# Patient Record
Sex: Female | Born: 1945 | Race: White | Hispanic: No | Marital: Married | Smoking: Never smoker
Health system: Southern US, Community
[De-identification: ages and names within clinical notes are randomized; demographics above are authoritative.]

## PROBLEM LIST (undated history)

## (undated) DIAGNOSIS — I4891 Unspecified atrial fibrillation: Secondary | ICD-10-CM

## (undated) DIAGNOSIS — I10 Essential (primary) hypertension: Secondary | ICD-10-CM

## (undated) DIAGNOSIS — Z9049 Acquired absence of other specified parts of digestive tract: Secondary | ICD-10-CM

## (undated) DIAGNOSIS — I4819 Other persistent atrial fibrillation: Secondary | ICD-10-CM

## (undated) DIAGNOSIS — T8859XA Other complications of anesthesia, initial encounter: Secondary | ICD-10-CM

## (undated) DIAGNOSIS — I469 Cardiac arrest, cause unspecified: Secondary | ICD-10-CM

## (undated) DIAGNOSIS — C4491 Basal cell carcinoma of skin, unspecified: Secondary | ICD-10-CM

## (undated) DIAGNOSIS — I499 Cardiac arrhythmia, unspecified: Secondary | ICD-10-CM

## (undated) DIAGNOSIS — I00 Rheumatic fever without heart involvement: Secondary | ICD-10-CM

## (undated) DIAGNOSIS — E876 Hypokalemia: Secondary | ICD-10-CM

## (undated) DIAGNOSIS — E785 Hyperlipidemia, unspecified: Secondary | ICD-10-CM

## (undated) DIAGNOSIS — T4145XA Adverse effect of unspecified anesthetic, initial encounter: Secondary | ICD-10-CM

## (undated) DIAGNOSIS — M1990 Unspecified osteoarthritis, unspecified site: Secondary | ICD-10-CM

## (undated) DIAGNOSIS — E039 Hypothyroidism, unspecified: Secondary | ICD-10-CM

## (undated) DIAGNOSIS — R42 Dizziness and giddiness: Secondary | ICD-10-CM

## (undated) DIAGNOSIS — K859 Acute pancreatitis without necrosis or infection, unspecified: Secondary | ICD-10-CM

## (undated) DIAGNOSIS — E739 Lactose intolerance, unspecified: Secondary | ICD-10-CM

## (undated) HISTORY — DX: Hypothyroidism, unspecified: E03.9

## (undated) HISTORY — DX: Rheumatic fever without heart involvement: I00

## (undated) HISTORY — DX: Acute pancreatitis without necrosis or infection, unspecified: K85.90

## (undated) HISTORY — DX: Dizziness and giddiness: R42

## (undated) HISTORY — DX: Unspecified atrial fibrillation: I48.91

## (undated) HISTORY — DX: Cardiac arrest, cause unspecified: I46.9

## (undated) HISTORY — DX: Unspecified osteoarthritis, unspecified site: M19.90

## (undated) HISTORY — DX: Essential (primary) hypertension: I10

## (undated) HISTORY — DX: Lactose intolerance, unspecified: E73.9

## (undated) HISTORY — DX: Acquired absence of other specified parts of digestive tract: Z90.49

## (undated) HISTORY — DX: Basal cell carcinoma of skin, unspecified: C44.91

## (undated) HISTORY — DX: Hyperlipidemia, unspecified: E78.5

## (undated) HISTORY — DX: Hypokalemia: E87.6

## (undated) HISTORY — DX: Other persistent atrial fibrillation: I48.19

## (undated) HISTORY — DX: Cardiac arrhythmia, unspecified: I49.9

---
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
## 1980-07-24 DIAGNOSIS — K859 Acute pancreatitis without necrosis or infection, unspecified: Secondary | ICD-10-CM

## 1980-07-24 HISTORY — DX: Acute pancreatitis without necrosis or infection, unspecified: K85.90

## 1984-07-24 DIAGNOSIS — I469 Cardiac arrest, cause unspecified: Secondary | ICD-10-CM

## 1984-07-24 HISTORY — DX: Cardiac arrest, cause unspecified: I46.9

## 2004-07-24 HISTORY — PX: OTHER SURGICAL HISTORY: SHX169

## 2004-07-24 HISTORY — PX: MITRAL VALVE REPAIR (MV)/CORONARY ARTERY BYPASS GRAFTING (CABG): SHX5983

## 2014-05-25 HISTORY — PX: ICD GENERATOR CHANGE: SHX5854

## 2017-06-22 ENCOUNTER — Encounter: Payer: Self-pay | Admitting: Internal Medicine

## 2017-07-02 ENCOUNTER — Institutional Professional Consult (permissible substitution): Payer: Self-pay | Admitting: Internal Medicine

## 2017-07-06 ENCOUNTER — Ambulatory Visit: Payer: Medicare Other | Admitting: Internal Medicine

## 2017-07-06 ENCOUNTER — Encounter: Payer: Self-pay | Admitting: Internal Medicine

## 2017-07-06 VITALS — BP 122/78 | HR 78 | Ht 62.0 in | Wt 122.6 lb

## 2017-07-06 DIAGNOSIS — I4901 Ventricular fibrillation: Secondary | ICD-10-CM

## 2017-07-06 DIAGNOSIS — I4891 Unspecified atrial fibrillation: Secondary | ICD-10-CM

## 2017-07-06 DIAGNOSIS — I495 Sick sinus syndrome: Secondary | ICD-10-CM

## 2017-07-06 NOTE — Progress Notes (Signed)
Electrophysiology Office Note   Date:  07/06/2017   ID:  Nicole Hampton, Nicole Hampton, Nicole Hampton, MRN [id number]  PCP:  Shon Baton, MD  Cardiologist:  Dr Selena Batten Primary Electrophysiologist: Dr Rosita Fire  Chief Complaint  Patient presents with  . Atrial Fibrillation     History of Present Illness: Nicole Clouatre. Hampton is a 71 y.o. female who presents today for electrophysiology evaluation.   She is s/p prior VF arrest, years ago.  She is s/p ICD.  She has required prior lead revision.  She has been followed by Dr Rosita Fire at [facility] for many years.  She lives in [place] and presents today to establish in our practice.  She typically is followed twice per year by Dr Rosita Fire or his APP.  She appears to have a chronically elevated RV threshold.  She reports having only inappropriate therapy since device implant (due to prior lead fracture).  She is also s/p MV repair and CABG in 2006.    She is very active.  She exercises regularly without compliant. Today, she denies symptoms of palpitations, chest pain, shortness of breath, orthopnea, PND, lower extremity edema, claudication, dizziness, presyncope, syncope, bleeding, or neurologic sequela. The patient is tolerating medications without difficulties and is otherwise without complaint today.    Past Medical History:  Diagnosis Date  . A-fib ([place])   . BCC (basal cell carcinoma)    SEES DERM.  [name] Hyperlipidemia    WITH ELEVATED LDL/HDL CLEAN CORS 2006. DR. Norville Haggard  . Hypertension   . Hypokalemia    CHRONIC  . Hypothyroidism   . Lactose intolerance   . OA (osteoarthritis)    HANDS AND FEET  . Pancreatitis 1982   GB  . S/P cholecystectomy    OPEN  . Sudden cardiac death ([place]) [place]. AICD  . Vertigo    RECURRENT   Current Outpatient Medications  Medication Sig Dispense Refill  . aMILoride (MIDAMOR) 5 MG tablet Take 5 mg by mouth daily.    [name] apixaban (ELIQUIS) 2.5 MG TABS tablet Take 2.5 mg by mouth 2  (two) times daily.    . Calcium Carbonate-Vitamin D (CALTRATE 600+D PO) Take 2 tablets by mouth daily.     [name] estradiol (ESTRACE) 1 MG tablet Take 1 mg by mouth daily.    [name] ezetimibe (ZETIA) 10 MG tablet Take 10 mg by mouth daily.    [name] Fexofenadine HCl (ALLEGRA ALLERGY PO) Take 1 tablet by mouth daily as needed.     . fluticasone (FLONASE) 50 MCG/ACT nasal spray Place 2 sprays into both nostrils daily as needed for allergies.     [name] levothyroxine (SYNTHROID) 25 MCG tablet Take 25 mcg by mouth daily before breakfast.    . Magnesium 250 MG TABS Take 1 tablet by mouth daily.    . meclizine (ANTIVERT) 12.5 MG tablet Take 12.5 mg by mouth 3 (three) times daily as needed for dizziness.    . Multiple Vitamin (MULTIVITAMIN) tablet Take 1 tablet by mouth daily.    [name] omega-3 acid ethyl esters (LOVAZA) 1 g capsule Take 2 g by mouth daily.    . pindolol (VISKEN) 5 MG tablet Take 3/4 tab by mouth daily.    . potassium chloride (MICRO-K) 10 MEQ CR capsule Take 4 tablets by mouth in the morning and 3 tablets in the evening    . temazepam (RESTORIL) 7.5 MG capsule Take 7.5 mg by mouth at bedtime as needed for sleep.  No current facility-administered medications for this visit.     Allergies:   Cefuroxime axetil; Codeine; Morphine; Quinidine gluconate; Quinidine; and Latex   Social History:  The patient  reports that she quit smoking about 42 years ago. she has never used smokeless tobacco. She reports that she drinks alcohol. She reports that she does not use drugs.   Family History:  The patient's  family history includes ALS in her sister; Arthritis (age of onset: 49) in her mother; Hypertension (age of onset: 85) in her father; Macular degeneration in her brother; Pancreatic cancer in her brother.    ROS:  Please see the history of present illness.   All other systems are personally reviewed and negative.    PHYSICAL EXAM: VS:  BP 122/78   Pulse 78   Ht 5\' 2"  (1.575 m)   Wt 122 lb 9.6 oz (55.6  kg)   SpO2 99%   BMI 22.42 kg/m  , BMI Body mass index is 22.42 kg/m. GEN: Well nourished, well developed, in no acute distress  HEENT: normal  Neck: no JVD, carotid bruits, or masses Cardiac: RRR; no murmurs, rubs, or gallops,no edema  Respiratory:  clear to auscultation bilaterally, normal work of breathing GI: soft, nontender, nondistended, + BS MS: no deformity or atrophy  Skin: warm and dry  Neuro:  Strength and sensation are intact Psych: euthymic mood, full affect  EKG:  EKG is ordered today. The ekg ordered today is personally reviewed and shows atrial paced rhythm with PVC, PR 248 msec, Qtc 453 msec    Wt Readings from Last 3 Encounters:  07/06/17 122 lb 9.6 oz (55.6 kg)     Other studies personally reviewed: Additional studies/ records that were reviewed today include: notes from [facility] EP and also Dr [place] [employer] office Review of the above records today demonstrates: as above   ASSESSMENT AND PLAN:  1.  Prior VF arrest Doing well with ICD in place for many years Normal device function, though RV threshold is chronically elevated.  No changes at this time.  Will follow remotely with [facility]  2. Prior MV repair Repeat echo upon return in 6 months  3. Afib/ atach Very low burden by device interrogation chads2vasc score is at least 3.  She has been placed on eliquis but only at 2.5mg  BID.  She is not interested in increasing to 5mg  bid at this time. No changes.  She may be willing to reconsider if her  AF burden increases.  Follow-up:  Return to see me in 6 months She plans to follow-up with Dr Rosita Fire in January  Current medicines are reviewed at length with the patient today.   The patient does not have concerns regarding her medicines.  The following changes were made today:  none   Signed, Thompson Grayer, MD  07/06/2017 4:01 PM     [facility] [address] [place] [address] [phone] (office) [phone] (fax)

## 2017-07-06 NOTE — Patient Instructions (Signed)
Medication Instructions:  Your physician recommends that you continue on your current medications as directed. Please refer to the Current Medication list given to you today.   Labwork: None ordered   Testing/Procedures: Your physician has requested that you have an echocardiogram. Echocardiography is a painless test that uses sound waves to create images of your heart. It provides your doctor with information about the size and shape of your heart and how well your heart's chambers and valves are working. This procedure takes approximately one hour. There are no restrictions for this procedure.--6 months prior to appointment    Follow-Up: Your physician recommends that you schedule a follow-up appointment in: 6 months with Dr Rayann Heman   Any Other Special Instructions Will Be Listed Below (If Applicable).     If you need a refill on your cardiac medications before your next appointment, please call your pharmacy.

## 2017-09-13 ENCOUNTER — Telehealth: Payer: Self-pay | Admitting: Rheumatology

## 2017-09-13 NOTE — Telephone Encounter (Signed)
Patient called requesting prescription refill of Voltaren Gel.  Patient uses [facility] in [place].

## 2017-09-13 NOTE — Telephone Encounter (Signed)
Left message to advise patient we would be unable to fill Voltaren Gel as she has not had an appointment in our office since 2016. Advised she would need an appointment or to get the prescription filled from PCP.

## 2017-09-13 NOTE — Telephone Encounter (Signed)
Patient was last seen in office in 2016 and is asking for a refill on Voltaren Gel. Patient does not have a follow up with [place]. We were seeing her for OA hands, OA feet, knee joint pain and Raynaud's. Are we okay to refill the Voltaren Gel?

## 2017-09-13 NOTE — Telephone Encounter (Signed)
She will have to be seen again to receive any prescriptions.  Otherwise she can get prescription from her PCP.

## 2017-10-08 ENCOUNTER — Ambulatory Visit (INDEPENDENT_AMBULATORY_CARE_PROVIDER_SITE_OTHER): Payer: Medicare Other | Admitting: *Deleted

## 2017-10-08 DIAGNOSIS — I4901 Ventricular fibrillation: Secondary | ICD-10-CM

## 2017-10-08 NOTE — Progress Notes (Signed)
Remote ICD transmission.   

## 2017-10-09 LAB — CUP PACEART REMOTE DEVICE CHECK
Brady Statistic AP VS Percent: 93.73 %
Brady Statistic AS VP Percent: 0.73 %
Brady Statistic RA Percent Paced: 92.25 %
Date Time Interrogation Session: 20190318073423
HighPow Impedance: 38 Ohm
HighPow Impedance: 49 Ohm
Implantable Lead Implant Date: 20030319
Implantable Lead Location: 753859
Implantable Lead Model: 5076
Lead Channel Impedance Value: 456 Ohm
Lead Channel Pacing Threshold Amplitude: 1 V
Lead Channel Pacing Threshold Amplitude: 2.5 V
Lead Channel Pacing Threshold Pulse Width: 0.4 ms
Lead Channel Sensing Intrinsic Amplitude: 0.625 mV
Lead Channel Sensing Intrinsic Amplitude: 0.625 mV
Lead Channel Setting Pacing Amplitude: 4.5 V
MDC IDC LEAD IMPLANT DT: 20141103
MDC IDC LEAD LOCATION: 753860
MDC IDC MSMT BATTERY REMAINING LONGEVITY: 54 mo
MDC IDC MSMT BATTERY VOLTAGE: 2.97 V
MDC IDC MSMT LEADCHNL RA PACING THRESHOLD PULSEWIDTH: 0.4 ms
MDC IDC MSMT LEADCHNL RV IMPEDANCE VALUE: 703 Ohm
MDC IDC MSMT LEADCHNL RV IMPEDANCE VALUE: 722 Ohm
MDC IDC MSMT LEADCHNL RV SENSING INTR AMPL: 3.75 mV
MDC IDC MSMT LEADCHNL RV SENSING INTR AMPL: 3.75 mV
MDC IDC PG IMPLANT DT: 20171103
MDC IDC SET LEADCHNL RA PACING AMPLITUDE: 2 V
MDC IDC SET LEADCHNL RV PACING PULSEWIDTH: 1 ms
MDC IDC SET LEADCHNL RV SENSING SENSITIVITY: 0.45 mV
MDC IDC STAT BRADY AP VP PERCENT: 0.45 %
MDC IDC STAT BRADY AS VS PERCENT: 5.09 %
MDC IDC STAT BRADY RV PERCENT PACED: 1.21 %

## 2017-10-10 ENCOUNTER — Encounter: Payer: Self-pay | Admitting: Cardiology

## 2017-10-22 ENCOUNTER — Telehealth: Payer: Self-pay | Admitting: Internal Medicine

## 2017-10-22 NOTE — Telephone Encounter (Signed)
°  New message  Pt verbalized that she is calling for RN  To see about her Echo options and loactions

## 2017-10-22 NOTE — Telephone Encounter (Signed)
I spoke with patient. She states she is scheduled for a echo at [facility] with Dr. Clementeen Graham on 11/14/17. She would like to know if she needed to keep her appt for 11/21/17. I explained to patient if she has her echo done at [facility] then she could request a copy or have a copy faxed to our office. Patient states she would follow up with Dr. Verita Lamb office and call back to cancel echo if needed. Patient appreciative of the call

## 2017-11-21 ENCOUNTER — Other Ambulatory Visit (HOSPITAL_COMMUNITY): Payer: Medicare Other

## 2017-12-03 ENCOUNTER — Ambulatory Visit: Payer: Medicare Other | Admitting: Internal Medicine

## 2017-12-03 ENCOUNTER — Encounter: Payer: Self-pay | Admitting: Internal Medicine

## 2017-12-03 VITALS — BP 122/62 | HR 90 | Ht 62.0 in | Wt 119.0 lb

## 2017-12-03 DIAGNOSIS — I495 Sick sinus syndrome: Secondary | ICD-10-CM

## 2017-12-03 DIAGNOSIS — Z95810 Presence of automatic (implantable) cardiac defibrillator: Secondary | ICD-10-CM | POA: Diagnosis not present

## 2017-12-03 DIAGNOSIS — I4901 Ventricular fibrillation: Secondary | ICD-10-CM | POA: Diagnosis not present

## 2017-12-03 DIAGNOSIS — I4891 Unspecified atrial fibrillation: Secondary | ICD-10-CM | POA: Diagnosis not present

## 2017-12-03 LAB — CUP PACEART INCLINIC DEVICE CHECK
Brady Statistic AP VP Percent: 0.37 %
Brady Statistic AP VS Percent: 98.65 %
Brady Statistic AS VP Percent: 0.06 %
Brady Statistic RA Percent Paced: 96.66 %
Brady Statistic RV Percent Paced: 0.5 %
HIGH POWER IMPEDANCE MEASURED VALUE: 39 Ohm
HIGH POWER IMPEDANCE MEASURED VALUE: 55 Ohm
Implantable Lead Implant Date: 20141103
Implantable Lead Location: 753859
Implantable Lead Model: 5076
Lead Channel Impedance Value: 456 Ohm
Lead Channel Impedance Value: 703 Ohm
Lead Channel Pacing Threshold Pulse Width: 0.4 ms
Lead Channel Pacing Threshold Pulse Width: 1 ms
Lead Channel Sensing Intrinsic Amplitude: 2.625 mV
Lead Channel Sensing Intrinsic Amplitude: 3.125 mV
Lead Channel Setting Pacing Amplitude: 2 V
Lead Channel Setting Pacing Amplitude: 4.5 V
Lead Channel Setting Pacing Pulse Width: 1 ms
Lead Channel Setting Sensing Sensitivity: 0.45 mV
MDC IDC LEAD IMPLANT DT: 20030319
MDC IDC LEAD LOCATION: 753860
MDC IDC MSMT BATTERY REMAINING LONGEVITY: 49 mo
MDC IDC MSMT BATTERY VOLTAGE: 2.97 V
MDC IDC MSMT LEADCHNL RA PACING THRESHOLD AMPLITUDE: 1 V
MDC IDC MSMT LEADCHNL RA SENSING INTR AMPL: 0.625 mV
MDC IDC MSMT LEADCHNL RA SENSING INTR AMPL: 1.625 mV
MDC IDC MSMT LEADCHNL RV IMPEDANCE VALUE: 779 Ohm
MDC IDC MSMT LEADCHNL RV PACING THRESHOLD AMPLITUDE: 3 V
MDC IDC PG IMPLANT DT: 20171103
MDC IDC SESS DTM: 20190513163843
MDC IDC STAT BRADY AS VS PERCENT: 0.92 %

## 2017-12-03 NOTE — Progress Notes (Signed)
PCP: Shon Baton, MD Primary Cardiologist: [place] Primary EP: Dr Leonidas Romberg previously  Nicole Hampton. Nicole Hampton is a 72 y.o. female who presents today for routine electrophysiology followup.  Since last being seen in our clinic, the patient reports doing very well.  Today, she denies symptoms of palpitations, chest pain, shortness of breath,  lower extremity edema, dizziness, presyncope, syncope, or ICD shocks.  The patient is otherwise without complaint today.   Past Medical History:  Diagnosis Date  . A-fib ([place])   . BCC (basal cell carcinoma)    SEES DERM.  [name] Hyperlipidemia    WITH ELEVATED LDL/HDL CLEAN CORS 2006. DR. Norville Haggard  . Hypertension   . Hypokalemia    CHRONIC  . Hypothyroidism   . Lactose intolerance   . OA (osteoarthritis)    HANDS AND FEET  . Pancreatitis 1982   GB  . S/P cholecystectomy    OPEN  . Sudden cardiac death ([place]) [place]. AICD  . Vertigo    RECURRENT    ROS- all systems are reviewed and negative except as per HPI above  Current Outpatient Medications  Medication Sig Dispense Refill  . aMILoride (MIDAMOR) 5 MG tablet Take 5 mg by mouth daily.    [name] apixaban (ELIQUIS) 2.5 MG TABS tablet Take 2.5 mg by mouth 2 (two) times daily.    . Calcium Carbonate-Vitamin D (CALTRATE 600+D PO) Take 2 tablets by mouth daily.     [name] estradiol (ESTRACE) 1 MG tablet Take 1 mg by mouth daily.    [name] ezetimibe (ZETIA) 10 MG tablet Take 10 mg by mouth daily.    [name] Fexofenadine HCl (ALLEGRA ALLERGY PO) Take 1 tablet by mouth daily as needed.     . fluticasone (FLONASE) 50 MCG/ACT nasal spray Place 2 sprays into both nostrils daily as needed for allergies.     [name] levothyroxine (SYNTHROID) 25 MCG tablet Take 25 mcg by mouth daily before breakfast.    . Magnesium 250 MG TABS Take 1 tablet by mouth daily.    . meclizine (ANTIVERT) 12.5 MG tablet Take 12.5 mg by mouth 3 (three) times daily as needed for dizziness.    . Multiple Vitamin (MULTIVITAMIN)  tablet Take 1 tablet by mouth daily.    [name] omega-3 acid ethyl esters (LOVAZA) 1 g capsule Take 2 g by mouth daily.    . pindolol (VISKEN) 5 MG tablet Take 3/4 tab by mouth daily.    . potassium chloride (MICRO-K) 10 MEQ CR capsule Take 4 tablets by mouth in the morning and 3 tablets in the evening    . temazepam (RESTORIL) 7.5 MG capsule Take 7.5 mg by mouth at bedtime as needed for sleep.      No current facility-administered medications for this visit.     Physical Exam: Vitals:   12/03/17 1518  BP: 122/62  Pulse: 90  SpO2: 99%  Weight: 119 lb (54 kg)  Height: 5\' 2"  (1.575 m)    GEN- The patient is well appearing, alert and oriented x 3 today.   Head- normocephalic, atraumatic Eyes-  Sclera clear, conjunctiva pink Ears- hearing intact Oropharynx- clear Lungs- Clear to ausculation bilaterally, normal work of breathing Chest- ICD pocket is well healed Heart- Regular rate and rhythm, no murmurs, rubs or gallops, PMI not laterally displaced GI- soft, NT, ND, + BS Extremities- no clubbing, cyanosis, or edema  ICD interrogation- reviewed in detail today,  See PACEART report   Echo 11/21/17 reveals EF  60-65%, RV size/ function are normal LA is moderately enlarged Mean MV gradient is 5.9 mm Hg s/p repair, mild to moderate TR, no pulm htn  Wt Readings from Last 3 Encounters:  12/03/17 119 lb (54 kg)  07/06/17 122 lb 9.6 oz (55.6 kg)    Assessment and Plan:  1.  Prior VF Arrest Normal ICD function See Pace Art report RV threshold is chronically elevated  2. Afib/ atach Burden is 0.4 % by device interrogation chads2vasc score is 3.  On elilqus  3. S/p MV repair Echo reviewed from [facility]  Return to see EP NP in 6 months I will see in a year.  I would like to have an echo upon return to see me in a year  Thompson Grayer MD, [facility] 12/03/2017 3:50 PM

## 2017-12-03 NOTE — Patient Instructions (Addendum)
Medication Instructions:  Your physician recommends that you continue on your current medications as directed. Please refer to the Current Medication list given to you today.  Labwork: None ordered.  Testing/Procedures: None ordered.  Follow-Up:  Your physician wants you to follow-up in: 6 months with Chanetta Marshall, NP.   You will receive a reminder letter in the mail two months in advance. If you don't receive a letter, please call our office to schedule the follow-up appointment.  Your physician wants you to follow-up in: one year with Dr. Rayann Heman.   You will receive a reminder letter in the mail two months in advance. If you don't receive a letter, please call our office to schedule the follow-up appointment.  Remote monitoring is used to monitor your ICD from home. This monitoring reduces the number of office visits required to check your device to one time per year. It allows [place] to keep an eye on the functioning of your device to ensure it is working properly. You are scheduled for a device check from home on 01/07/2018. You may send your transmission at any time that day. If you have a wireless device, the transmission will be sent automatically. After your physician reviews your transmission, you will receive a postcard with your next transmission date.  Any Other Special Instructions Will Be Listed Below (If Applicable).  If you need a refill on your cardiac medications before your next appointment, please call your pharmacy.

## 2018-01-07 ENCOUNTER — Ambulatory Visit (INDEPENDENT_AMBULATORY_CARE_PROVIDER_SITE_OTHER): Payer: Medicare Other | Admitting: *Deleted

## 2018-01-07 DIAGNOSIS — I4901 Ventricular fibrillation: Secondary | ICD-10-CM

## 2018-01-07 NOTE — Progress Notes (Signed)
Remote ICD transmission.   

## 2018-01-08 LAB — CUP PACEART REMOTE DEVICE CHECK
Battery Remaining Longevity: 47 mo
Brady Statistic AP VP Percent: 0.3 %
Brady Statistic AP VS Percent: 98.8 %
Brady Statistic AS VS Percent: 0.9 %
Brady Statistic RA Percent Paced: 98 %
Brady Statistic RV Percent Paced: 0.36 %
HighPow Impedance: 38 Ohm
HighPow Impedance: 49 Ohm
Implantable Lead Implant Date: 20030319
Implantable Lead Implant Date: 20141103
Implantable Lead Location: 753860
Implantable Lead Model: 5076
Implantable Lead Model: 6947
Lead Channel Impedance Value: 456 Ohm
Lead Channel Impedance Value: 665 Ohm
Lead Channel Pacing Threshold Pulse Width: 0.4 ms
Lead Channel Pacing Threshold Pulse Width: 0.4 ms
Lead Channel Sensing Intrinsic Amplitude: 0.5 mV
Lead Channel Sensing Intrinsic Amplitude: 0.5 mV
Lead Channel Sensing Intrinsic Amplitude: 2.875 mV
Lead Channel Setting Pacing Amplitude: 2 V
MDC IDC LEAD LOCATION: 753859
MDC IDC MSMT BATTERY VOLTAGE: 2.97 V
MDC IDC MSMT LEADCHNL RA PACING THRESHOLD AMPLITUDE: 1 V
MDC IDC MSMT LEADCHNL RV IMPEDANCE VALUE: 646 Ohm
MDC IDC MSMT LEADCHNL RV PACING THRESHOLD AMPLITUDE: 2.5 V
MDC IDC MSMT LEADCHNL RV SENSING INTR AMPL: 2.875 mV
MDC IDC PG IMPLANT DT: 20171103
MDC IDC SESS DTM: 20190617073322
MDC IDC SET LEADCHNL RV PACING AMPLITUDE: 4.5 V
MDC IDC SET LEADCHNL RV PACING PULSEWIDTH: 1 ms
MDC IDC SET LEADCHNL RV SENSING SENSITIVITY: 0.45 mV
MDC IDC STAT BRADY AS VP PERCENT: 0 %

## 2018-04-08 ENCOUNTER — Ambulatory Visit (INDEPENDENT_AMBULATORY_CARE_PROVIDER_SITE_OTHER): Payer: Medicare Other | Admitting: *Deleted

## 2018-04-08 DIAGNOSIS — I4901 Ventricular fibrillation: Secondary | ICD-10-CM | POA: Diagnosis not present

## 2018-04-08 NOTE — Progress Notes (Signed)
Remote ICD transmission.   

## 2018-05-01 LAB — CUP PACEART REMOTE DEVICE CHECK
Battery Voltage: 2.97 V
Brady Statistic AP VP Percent: 0.29 %
Brady Statistic AP VS Percent: 99.07 %
Brady Statistic AS VP Percent: 0.02 %
Brady Statistic AS VS Percent: 0.62 %
Date Time Interrogation Session: 20190916052503
HighPow Impedance: 41 Ohm
HighPow Impedance: 53 Ohm
Implantable Lead Implant Date: 20030319
Implantable Lead Location: 753860
Implantable Lead Model: 6947
Lead Channel Impedance Value: 760 Ohm
Lead Channel Pacing Threshold Amplitude: 1 V
Lead Channel Pacing Threshold Pulse Width: 0.4 ms
Lead Channel Sensing Intrinsic Amplitude: 1.875 mV
Lead Channel Sensing Intrinsic Amplitude: 1.875 mV
Lead Channel Sensing Intrinsic Amplitude: 3.625 mV
Lead Channel Setting Pacing Pulse Width: 1 ms
Lead Channel Setting Sensing Sensitivity: 0.45 mV
MDC IDC LEAD IMPLANT DT: 20141103
MDC IDC LEAD LOCATION: 753859
MDC IDC MSMT BATTERY REMAINING LONGEVITY: 44 mo
MDC IDC MSMT LEADCHNL RA IMPEDANCE VALUE: 456 Ohm
MDC IDC MSMT LEADCHNL RV IMPEDANCE VALUE: 703 Ohm
MDC IDC MSMT LEADCHNL RV PACING THRESHOLD AMPLITUDE: 2.5 V
MDC IDC MSMT LEADCHNL RV PACING THRESHOLD PULSEWIDTH: 0.4 ms
MDC IDC MSMT LEADCHNL RV SENSING INTR AMPL: 3.625 mV
MDC IDC PG IMPLANT DT: 20171103
MDC IDC SET LEADCHNL RA PACING AMPLITUDE: 2 V
MDC IDC SET LEADCHNL RV PACING AMPLITUDE: 4.5 V
MDC IDC STAT BRADY RA PERCENT PACED: 98.19 %
MDC IDC STAT BRADY RV PERCENT PACED: 0.4 %

## 2018-06-05 ENCOUNTER — Encounter: Payer: Self-pay | Admitting: Nurse Practitioner

## 2018-06-05 ENCOUNTER — Ambulatory Visit: Payer: Medicare Other | Admitting: Nurse Practitioner

## 2018-06-05 VITALS — BP 122/70 | HR 93 | Ht 62.0 in | Wt 119.0 lb

## 2018-06-05 DIAGNOSIS — I480 Paroxysmal atrial fibrillation: Secondary | ICD-10-CM

## 2018-06-05 DIAGNOSIS — Z9889 Other specified postprocedural states: Secondary | ICD-10-CM | POA: Diagnosis not present

## 2018-06-05 DIAGNOSIS — I4901 Ventricular fibrillation: Secondary | ICD-10-CM | POA: Diagnosis not present

## 2018-06-05 LAB — CUP PACEART INCLINIC DEVICE CHECK
Implantable Lead Implant Date: 20030319
Implantable Lead Location: 753859
Implantable Lead Location: 753860
Implantable Lead Model: 6947
Implantable Pulse Generator Implant Date: 20171103
MDC IDC LEAD IMPLANT DT: 20141103
MDC IDC SESS DTM: 20191113082729

## 2018-06-05 NOTE — Progress Notes (Signed)
Electrophysiology Office Note Date: 06/05/2018  ID:  Nicole Hampton, Baugh September 19, 1945, MRN [id number]  PCP: Shon Baton, MD Primary Cardiologist: [place] Electrophysiologist: Rosita Fire -> [facility]  CC: Routine ICD follow-up  Nicole Hampton is a 71 y.o. female seen today for Dr Rayann Heman.  She presents today for routine electrophysiology followup.  Since last being seen in our clinic, the patient reports doing very well. She denies chest pain, palpitations, dyspnea, PND, orthopnea, nausea, vomiting, dizziness, syncope, edema, weight gain, or early satiety.  She has not had ICD shocks.   Device History: MDT single chamber ICD implanted 2003 for VF arrest; upgrade to dual chamber ICD 2014; gen change 2017 History of appropriate therapy: No History of AAD therapy: No   Past Medical History:  Diagnosis Date  . A-fib ([place])   . BCC (basal cell carcinoma)    SEES DERM.  [name] Hyperlipidemia    WITH ELEVATED LDL/HDL CLEAN CORS 2006. DR. Norville Haggard  . Hypertension   . Hypokalemia    CHRONIC  . Hypothyroidism   . Lactose intolerance   . OA (osteoarthritis)    HANDS AND FEET  . Pancreatitis 1982   GB  . S/P cholecystectomy    OPEN  . Sudden cardiac death ([place]) [place]. AICD  . Vertigo    RECURRENT   Past Surgical History:  Procedure Laterality Date  . CESAREAN SECTION     x1  . ICD GENERATOR CHANGE  05/25/2014   MDT Gwyneth Revels S DR ICD implanted by Dr Rosita Fire (secondary prevention)  . 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 OPEN HEART SURGERY WITH DEFIB  2006   1996    Current Outpatient Medications  Medication Sig Dispense Refill  . aMILoride (MIDAMOR) 5 MG tablet Take 5 mg by mouth daily.    [name] apixaban (ELIQUIS) 2.5 MG TABS tablet Take 2.5 mg by mouth 2 (two) times daily.    . Calcium Carbonate-Vitamin D (CALTRATE 600+D PO) Take 2 tablets by mouth daily.     [name] estradiol  (ESTRACE) 1 MG tablet Take 1 mg by mouth daily.    [name] ezetimibe (ZETIA) 10 MG tablet Take 10 mg by mouth daily.    [name] Fexofenadine HCl (ALLEGRA ALLERGY PO) Take 1 tablet by mouth daily as needed.     . fluticasone (FLONASE) 50 MCG/ACT nasal spray Place 2 sprays into both nostrils daily as needed for allergies.     [name] levothyroxine (SYNTHROID) 25 MCG tablet Take 25 mcg by mouth daily before breakfast.    . Magnesium 250 MG TABS Take 1 tablet by mouth daily.    . meclizine (ANTIVERT) 12.5 MG tablet Take 12.5 mg by mouth 3 (three) times daily as needed for dizziness.    . Multiple Vitamin (MULTIVITAMIN) tablet Take 1 tablet by mouth daily.    [name] omega-3 acid ethyl esters (LOVAZA) 1 g capsule Take 2 g by mouth daily.    . pindolol (VISKEN) 5 MG tablet Take 3/4 tab by mouth daily.    . potassium chloride (MICRO-K) 10 MEQ CR capsule Take 4 tablets by mouth in the morning and 3 tablets in the evening    . temazepam (RESTORIL) 7.5 MG capsule Take 7.5 mg by mouth at bedtime as needed for sleep.      No current facility-administered medications for this visit.     Allergies:   Cefuroxime axetil; Codeine;  Morphine; Quinidine gluconate; Quinidine; and Latex   Social History: Social History   Socioeconomic History  . Marital status: Married    Spouse name: [place]  . Number of children: 2  . Years of education: Not on file  . Highest education level: Not on file  Occupational History  . Occupation: [employer]  Social Needs  . Financial resource strain: Not on file  . Food insecurity:    Worry: Not on file    Inability: Not on file  . Transportation needs:    Medical: Not on file    Non-medical: Not on file  Tobacco Use  . Smoking status: Former Smoker    Last attempt to quit: 06/23/1975    Years since quitting: 42.9  . Smokeless tobacco: Never Used  Substance and Sexual Activity  . Alcohol use: Yes  . Drug use: No  . Sexual activity: Not on file  Lifestyle  . Physical activity:      Days per week: Not on file    Minutes per session: Not on file  . Stress: Not on file  Relationships  . Social connections:    Talks on phone: Not on file    Gets together: Not on file    Attends religious service: Not on file    Active member of club or organization: Not on file    Attends meetings of clubs or organizations: Not on file    Relationship status: Not on file  . Intimate partner violence:    Fear of current or ex partner: Not on file    Emotionally abused: Not on file    Physically abused: Not on file    Forced sexual activity: Not on file  Other Topics Concern  . Not on file  Social History Narrative  . Not on file    Family History: Family History  Problem Relation Age of Onset  . Arthritis Mother 62       DROWNING WITH HURRICANE [name]  . Hypertension Father 34       DROWNING WITH HURRICANE [name]  . ALS Sister   . Pancreatic cancer Brother   . Macular degeneration Brother     Review of Systems: All other systems reviewed and are otherwise negative except as noted above.   Physical Exam: VS:  BP 122/70   Pulse 93   Ht 5\' 2"  (1.575 m)   Wt 119 lb (54 kg)   SpO2 99%   BMI 21.77 kg/m  , BMI Body mass index is 21.77 kg/m.  GEN- The patient is well appearing, alert and oriented x 3 today.   HEENT: normocephalic, atraumatic; sclera clear, conjunctiva pink; hearing intact; oropharynx clear; neck supple  Lungs- Clear to ausculation bilaterally, normal work of breathing.  No wheezes, rales, rhonchi Heart- Regular rate and rhythm  GI- soft, non-tender, non-distended, bowel sounds present  Extremities- no clubbing, cyanosis, or edema  MS- no significant deformity or atrophy Skin- warm and dry, no rash or lesion; ICD pocket well healed Psych- euthymic mood, full affect Neuro- strength and sensation are intact  ICD interrogation- reviewed in detail today,  See PACEART report  EKG:  EKG is not ordered today.  Recent Labs: No results found for  requested labs within last 8760 hours.   Wt Readings from Last 3 Encounters:  06/05/18 119 lb (54 kg)  12/03/17 119 lb (54 kg)  07/06/17 122 lb 9.6 oz (55.6 kg)     Other studies Reviewed: Additional studies/ records that were  reviewed today include: Dr Jackalyn Lombard office notes   Assessment and Plan:  1.  Prior VF arrest 1 episode of VT terminated with 1 spin of ATP 05/05/18, cycle length 256msec Normal ICD function See Pace Art report No changes today RV threshold is chronically elevated Labs recently checked by PCP  2.  Paroxysmal atrial fibrillation Continue Eliquis for CHADS2VASC of 3 Burden by device interrogation 0.1% CBC, BMET recently checked by PCP She should be on Eliquis 5mg  twice daily - she declines change in dose today   3.  S/p MV repair Repeat echo in 6 months    Current medicines are reviewed at length with the patient today.   The patient does not have concerns regarding her medicines.  The following changes were made today:  none  Labs/ tests ordered today include: echo in 6 months  Orders Placed This Encounter  Procedures  . CUP PACEART [place]  . ECHOCARDIOGRAM COMPLETE     Disposition:   Follow up with [facility], Dr Rayann Heman 6 months with echo at that time     Signed, Chanetta Marshall, NP 06/05/2018 9:04 AM  [facility] [address] [place] [address] [phone] (office) [phone] (fax)

## 2018-06-05 NOTE — Patient Instructions (Addendum)
Medication Instructions:  NONE ORDERED  TODAY  If you need a refill on your cardiac medications before your next appointment, please call your pharmacy.   Lab work: NONE ORDERED  TODAY   If you have labs (blood work) drawn today and your tests are completely normal, you will receive your results only by: [name] MyChart Message (if you have MyChart) OR . A paper copy in the mail If you have any lab test that is abnormal or we need to change your treatment, we will call you to review the results.  Testing/Procedures:  SAME DAY AS [facility]  APPT Your physician has requested that you have an echocardiogram. Echocardiography is a painless test that uses sound waves to create images of your heart. It provides your doctor with information about the size and shape of your heart and how well your heart's chambers and valves are working. This procedure takes approximately one hour. There are no restrictions for this procedure.   Follow-Up:  AS SCHEDULED WITH DR Rayann Heman   Remote monitoring is used to monitor your Pacemaker of ICD from home. This monitoring reduces the number of office visits required to check your device to one time per year. It allows [place] to keep an eye on the functioning of your device to ensure it is working properly. You are scheduled for a device check from home on . 12-16-19You may send your transmission at any time that day. If you have a wireless device, the transmission will be sent automatically. After your physician reviews your transmission, you will receive a postcard with your next transmission date.    Any Other Special Instructions Will Be Listed Below (If Applicable).

## 2018-07-08 ENCOUNTER — Ambulatory Visit (INDEPENDENT_AMBULATORY_CARE_PROVIDER_SITE_OTHER): Payer: Medicare Other

## 2018-07-08 DIAGNOSIS — I495 Sick sinus syndrome: Secondary | ICD-10-CM | POA: Diagnosis not present

## 2018-07-08 NOTE — Progress Notes (Signed)
Remote ICD transmission.   

## 2018-08-18 LAB — CUP PACEART REMOTE DEVICE CHECK
Brady Statistic AS VP Percent: 0.01 %
Date Time Interrogation Session: 20191216083323
HIGH POWER IMPEDANCE MEASURED VALUE: 37 Ohm
HighPow Impedance: 47 Ohm
Implantable Lead Implant Date: 20030319
Implantable Lead Location: 753859
Implantable Lead Location: 753860
Implantable Lead Model: 5076
Implantable Pulse Generator Implant Date: 20171103
Lead Channel Pacing Threshold Amplitude: 0.75 V
Lead Channel Pacing Threshold Amplitude: 2.5 V
Lead Channel Sensing Intrinsic Amplitude: 1.875 mV
Lead Channel Setting Pacing Amplitude: 4.5 V
Lead Channel Setting Pacing Pulse Width: 1 ms
Lead Channel Setting Sensing Sensitivity: 0.45 mV
MDC IDC LEAD IMPLANT DT: 20141103
MDC IDC MSMT BATTERY REMAINING LONGEVITY: 36 mo
MDC IDC MSMT BATTERY VOLTAGE: 2.97 V
MDC IDC MSMT LEADCHNL RA IMPEDANCE VALUE: 399 Ohm
MDC IDC MSMT LEADCHNL RA PACING THRESHOLD PULSEWIDTH: 0.4 ms
MDC IDC MSMT LEADCHNL RA SENSING INTR AMPL: 1.875 mV
MDC IDC MSMT LEADCHNL RV IMPEDANCE VALUE: 608 Ohm
MDC IDC MSMT LEADCHNL RV IMPEDANCE VALUE: 665 Ohm
MDC IDC MSMT LEADCHNL RV PACING THRESHOLD PULSEWIDTH: 0.4 ms
MDC IDC MSMT LEADCHNL RV SENSING INTR AMPL: 3 mV
MDC IDC MSMT LEADCHNL RV SENSING INTR AMPL: 3 mV
MDC IDC SET LEADCHNL RA PACING AMPLITUDE: 2 V
MDC IDC STAT BRADY AP VP PERCENT: 0.25 %
MDC IDC STAT BRADY AP VS PERCENT: 98.72 %
MDC IDC STAT BRADY AS VS PERCENT: 1.03 %
MDC IDC STAT BRADY RA PERCENT PACED: 97.27 %
MDC IDC STAT BRADY RV PERCENT PACED: 0.29 %

## 2018-10-07 ENCOUNTER — Ambulatory Visit (INDEPENDENT_AMBULATORY_CARE_PROVIDER_SITE_OTHER): Payer: Medicare Other | Admitting: *Deleted

## 2018-10-07 ENCOUNTER — Other Ambulatory Visit: Payer: Self-pay

## 2018-10-07 DIAGNOSIS — I4901 Ventricular fibrillation: Secondary | ICD-10-CM | POA: Diagnosis not present

## 2018-10-08 LAB — CUP PACEART REMOTE DEVICE CHECK
Battery Remaining Longevity: 33 mo
Brady Statistic AP VS Percent: 98.09 %
Brady Statistic AS VP Percent: 0.05 %
Brady Statistic RA Percent Paced: 96.96 %
Brady Statistic RV Percent Paced: 0.48 %
HIGH POWER IMPEDANCE MEASURED VALUE: 39 Ohm
HIGH POWER IMPEDANCE MEASURED VALUE: 52 Ohm
Implantable Lead Implant Date: 20141103
Implantable Lead Location: 753859
Implantable Lead Location: 753860
Implantable Lead Model: 5076
Implantable Lead Model: 6947
Implantable Pulse Generator Implant Date: 20171103
Lead Channel Impedance Value: 418 Ohm
Lead Channel Impedance Value: 722 Ohm
Lead Channel Pacing Threshold Amplitude: 2.5 V
Lead Channel Pacing Threshold Pulse Width: 0.4 ms
Lead Channel Sensing Intrinsic Amplitude: 2.125 mV
Lead Channel Sensing Intrinsic Amplitude: 3.375 mV
Lead Channel Setting Pacing Amplitude: 2 V
Lead Channel Setting Pacing Amplitude: 4.5 V
Lead Channel Setting Pacing Pulse Width: 1 ms
Lead Channel Setting Sensing Sensitivity: 0.45 mV
MDC IDC LEAD IMPLANT DT: 20030319
MDC IDC MSMT BATTERY VOLTAGE: 2.94 V
MDC IDC MSMT LEADCHNL RA PACING THRESHOLD AMPLITUDE: 0.875 V
MDC IDC MSMT LEADCHNL RA PACING THRESHOLD PULSEWIDTH: 0.4 ms
MDC IDC MSMT LEADCHNL RA SENSING INTR AMPL: 2.125 mV
MDC IDC MSMT LEADCHNL RV IMPEDANCE VALUE: 760 Ohm
MDC IDC MSMT LEADCHNL RV SENSING INTR AMPL: 3.375 mV
MDC IDC SESS DTM: 20200316062604
MDC IDC STAT BRADY AP VP PERCENT: 0.37 %
MDC IDC STAT BRADY AS VS PERCENT: 1.49 %

## 2018-10-14 NOTE — Progress Notes (Signed)
Remote ICD transmission.   

## 2018-11-12 ENCOUNTER — Other Ambulatory Visit: Payer: Self-pay | Admitting: Internal Medicine

## 2018-12-05 ENCOUNTER — Telehealth: Payer: Self-pay

## 2018-12-05 NOTE — Telephone Encounter (Signed)
Spoke with pt regarding appt on 12/09/18. Pt was advise to check vitals prior to appt. Pt questions were address.

## 2018-12-06 ENCOUNTER — Telehealth (HOSPITAL_COMMUNITY): Payer: Self-pay | Admitting: Radiology

## 2018-12-06 NOTE — Telephone Encounter (Signed)

## 2018-12-09 ENCOUNTER — Other Ambulatory Visit: Payer: Self-pay

## 2018-12-09 ENCOUNTER — Encounter: Payer: Self-pay | Admitting: Internal Medicine

## 2018-12-09 ENCOUNTER — Ambulatory Visit (HOSPITAL_COMMUNITY): Payer: Medicare Other | Attending: Cardiology

## 2018-12-09 ENCOUNTER — Telehealth (INDEPENDENT_AMBULATORY_CARE_PROVIDER_SITE_OTHER): Payer: Medicare Other | Admitting: Internal Medicine

## 2018-12-09 VITALS — Ht 62.0 in | Wt 118.0 lb

## 2018-12-09 DIAGNOSIS — I10 Essential (primary) hypertension: Secondary | ICD-10-CM | POA: Insufficient documentation

## 2018-12-09 DIAGNOSIS — R9431 Abnormal electrocardiogram [ECG] [EKG]: Secondary | ICD-10-CM

## 2018-12-09 DIAGNOSIS — Z9889 Other specified postprocedural states: Secondary | ICD-10-CM

## 2018-12-09 DIAGNOSIS — I495 Sick sinus syndrome: Secondary | ICD-10-CM | POA: Diagnosis not present

## 2018-12-09 DIAGNOSIS — I050 Rheumatic mitral stenosis: Secondary | ICD-10-CM | POA: Insufficient documentation

## 2018-12-09 DIAGNOSIS — I480 Paroxysmal atrial fibrillation: Secondary | ICD-10-CM | POA: Diagnosis not present

## 2018-12-09 DIAGNOSIS — I4901 Ventricular fibrillation: Secondary | ICD-10-CM | POA: Diagnosis not present

## 2018-12-09 DIAGNOSIS — E785 Hyperlipidemia, unspecified: Secondary | ICD-10-CM | POA: Insufficient documentation

## 2018-12-09 NOTE — Telephone Encounter (Signed)
New Message   Patient returning your call, explained you will call her back as instructed.

## 2018-12-09 NOTE — Progress Notes (Signed)
Electrophysiology TeleHealth Note   Due to national recommendations of social distancing due to COVID 19, an audio/video telehealth visit is felt to be most appropriate for this patient at this time.  See MyChart message from today for the patient's consent to telehealth for [facility].  We used facetime for the visit today.   Date:  12/09/2018   ID:  Nicole Hampton, Nicole Hampton [birth year], MRN [id number]  Location: patient's home  Provider location: [address], [place]  Evaluation Performed: Follow-up visit  PCP:  Shon Baton, MD  Cardiologist:  Dr Selena Batten Electrophysiologist:  Dr Rayann Heman (previously Dr Rosita Fire)  Chief Complaint:  MV repair and arrhythmias  History of Present Illness:    Nicole Hampton is a 73 y.o. female who presents via audio/video conferencing for a telehealth visit today.  Since last being seen in our clinic, the patient reports doing very well.  Today, she denies symptoms of chest pain, shortness of breath,  lower extremity edema, dizziness, presyncope, or syncope.  She wakes at night at times with tachypalpitations.  The patient is otherwise without complaint today.  The patient denies symptoms of fevers, chills, cough, or new SOB worrisome for COVID 19.  Past Medical History:  Diagnosis Date  . A-fib ([place])   . BCC (basal cell carcinoma)    SEES DERM.  [name] Hyperlipidemia    WITH ELEVATED LDL/HDL CLEAN CORS 2006. DR. Norville Haggard  . Hypertension   . Hypokalemia    CHRONIC  . Hypothyroidism   . Lactose intolerance   . OA (osteoarthritis)    HANDS AND FEET  . Pancreatitis 1982   GB  . S/P cholecystectomy    OPEN  . 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 Vertigo    RECURRENT    Past Surgical History:  Procedure Laterality Date  . CESAREAN SECTION     x1  . ICD GENERATOR CHANGE  05/25/2014   MDT Gwyneth Revels S DR ICD implanted by Dr Rosita Fire (secondary prevention)  . MITRAL VALVE REPAIR (MV)/CORONARY  ARTERY BYPASS GRAFTING (CABG)  2006   NV ISSUES PROBABLY FROM RHEUMATIC FEVER  . OPEN HEART SURGERY WITH DEFIB  2006   1996    Current Outpatient Medications  Medication Sig Dispense Refill  . aMILoride (MIDAMOR) 5 MG tablet Take 5 mg by mouth daily.    [name] apixaban (ELIQUIS) 2.5 MG TABS tablet Take 2.5 mg by mouth 2 (two) times daily.    . Calcium Carbonate-Vitamin D (CALTRATE 600+D PO) Take 2 tablets by mouth daily.     [name] estradiol (ESTRACE) 1 MG tablet Take 1 mg by mouth daily.    [name] ezetimibe (ZETIA) 10 MG tablet Take 10 mg by mouth daily.    [name] Fexofenadine HCl (ALLEGRA ALLERGY PO) Take 1 tablet by mouth daily as needed.     . fluticasone (FLONASE) 50 MCG/ACT nasal spray Place 2 sprays into both nostrils daily as needed for allergies.     [name] levothyroxine (SYNTHROID) 25 MCG tablet Take 25 mcg by mouth daily before breakfast.    . Magnesium 250 MG TABS Take 1 tablet by mouth daily.    . meclizine (ANTIVERT) 12.5 MG tablet Take 12.5 mg by mouth 3 (three) times daily as needed for dizziness.    . Multiple Vitamin (MULTIVITAMIN) tablet Take 1 tablet by mouth daily.    [name] omega-3 acid ethyl esters (LOVAZA) 1 g capsule Take 2 g by mouth  daily.    . pindolol (VISKEN) 5 MG tablet TAKE ONE TABLET DAILY 90 tablet 1  . potassium chloride (MICRO-K) 10 MEQ CR capsule Take 4 tablets by mouth in the morning and 3 tablets in the evening    . temazepam (RESTORIL) 7.5 MG capsule Take 7.5 mg by mouth at bedtime as needed for sleep.      No current facility-administered medications for this visit.     Allergies:   Cefuroxime axetil; Codeine; Morphine; Quinidine gluconate; Quinidine; and Latex   Social History:  The patient  reports that she quit smoking about 43 years ago. She has never used smokeless tobacco. She reports current alcohol use. She reports that she does not use drugs.   Family History:  The patient's  family history includes ALS in her sister; Arthritis (age of onset: 48) in her mother;  Hypertension (age of onset: 49) in her father; Macular degeneration in her brother; Pancreatic cancer in her brother.   ROS:  Please see the history of present illness.   All other systems are personally reviewed and negative.    Exam:    Vital Signs:  Ht 5\' 2"  (1.575 m)   Wt 118 lb (53.5 kg)   BMI 21.58 kg/m   Well appearing, alert and conversant, regular work of breathing,  good skin color Eyes- anicteric, neuro- grossly intact, skin- no apparent rash or lesions or cyanosis, mouth- oral mucosa is pink   Labs/Other Tests and Data Reviewed:    Recent Labs: No results found for requested labs within last 8760 hours.   Wt Readings from Last 3 Encounters:  12/09/18 118 lb (53.5 kg)  06/05/18 119 lb (54 kg)  12/03/17 119 lb (54 kg)    Echo EF 60%, mild mitral stenosis reviewed at length with her.  Other studies personally reviewed: Additional studies/ records that were reviewed today include: my prior notes  Review of the above records today demonstrates: as above  Last device remote is reviewed from [facility] PDF dated 10/07/2018 which reveals normal device function, nonsustained VT   ASSESSMENT & PLAN:    1.  Prior VF arrest and nonsustained VT Normal ICD function Nonsustained VT noted Echo reviewed RV lead threshold chronically elevated Increase pindolol to 5 g daily  2. Paroxysmal atrial fibrillation chads2vasc score is 3.  afib burden <1%, On eliquis 2.5mg  BID.  We discussed that   3. S/p MV repair Echo reviewed with her today  4. COVID 19 screen The patient denies symptoms of COVID 19 at this time.  The importance of social distancing was discussed today.  Follow-up:  EP NP in a year Next remote: 3 months  Current medicines are reviewed at length with the patient today.   The patient does not have concerns regarding her medicines.  The following changes were made today:  none  Labs/ tests ordered today include:  No orders of the defined types were placed in  this encounter.  Patient Risk:  after full review of this patients clinical status, I feel that they are at moderate risk at this time.  Today, I have spent 22 minutes with the patient with telehealth technology discussing mv repair and arrhythmias .    SignedThompson Grayer, MD  12/09/2018 2:26 PM     [place] [address] [phone] (office) [phone] (fax)

## 2019-01-06 ENCOUNTER — Ambulatory Visit (INDEPENDENT_AMBULATORY_CARE_PROVIDER_SITE_OTHER): Payer: Medicare Other | Admitting: *Deleted

## 2019-01-06 ENCOUNTER — Telehealth: Payer: Self-pay | Admitting: Student

## 2019-01-06 DIAGNOSIS — I4901 Ventricular fibrillation: Secondary | ICD-10-CM

## 2019-01-06 LAB — CUP PACEART REMOTE DEVICE CHECK
Battery Remaining Longevity: 27 mo
Battery Remaining Longevity: 27 mo
Battery Voltage: 2.93 V
Battery Voltage: 2.93 V
Brady Statistic AP VP Percent: 0.47 %
Brady Statistic AP VP Percent: 1.14 %
Brady Statistic AP VS Percent: 83.12 %
Brady Statistic AP VS Percent: 0.3 %
Brady Statistic AS VP Percent: 3.41 %
Brady Statistic AS VP Percent: 39.87 %
Brady Statistic AS VS Percent: 13 %
Brady Statistic AS VS Percent: 58.7 %
Brady Statistic RA Percent Paced: 79.12 %
Brady Statistic RA Percent Paced: 1.12 %
Brady Statistic RV Percent Paced: 4 %
Brady Statistic RV Percent Paced: 42.25 %
Date Time Interrogation Session: 20200615083724
Date Time Interrogation Session: 20200615130036
HighPow Impedance: 36 Ohm
HighPow Impedance: 46 Ohm
HighPow Impedance: 36 Ohm
HighPow Impedance: 46 Ohm
Implantable Lead Implant Date: 20030319
Implantable Lead Implant Date: 20141103
Implantable Lead Implant Date: 20030319
Implantable Lead Implant Date: 20141103
Implantable Lead Location: 753859
Implantable Lead Location: 753860
Implantable Lead Location: 753859
Implantable Lead Location: 753860
Implantable Lead Model: 5076
Implantable Lead Model: 6947
Implantable Lead Model: 5076
Implantable Lead Model: 6947
Implantable Pulse Generator Implant Date: 20171103
Implantable Pulse Generator Implant Date: 20171103
Lead Channel Impedance Value: 418 Ohm
Lead Channel Impedance Value: 646 Ohm
Lead Channel Impedance Value: 646 Ohm
Lead Channel Impedance Value: 418 Ohm
Lead Channel Impedance Value: 646 Ohm
Lead Channel Impedance Value: 646 Ohm
Lead Channel Pacing Threshold Amplitude: 0.875 V
Lead Channel Pacing Threshold Amplitude: 2.5 V
Lead Channel Pacing Threshold Amplitude: 0.875 V
Lead Channel Pacing Threshold Amplitude: 2.5 V
Lead Channel Pacing Threshold Pulse Width: 0.4 ms
Lead Channel Pacing Threshold Pulse Width: 0.4 ms
Lead Channel Pacing Threshold Pulse Width: 0.4 ms
Lead Channel Pacing Threshold Pulse Width: 0.4 ms
Lead Channel Sensing Intrinsic Amplitude: 2.25 mV
Lead Channel Sensing Intrinsic Amplitude: 2.25 mV
Lead Channel Sensing Intrinsic Amplitude: 3.375 mV
Lead Channel Sensing Intrinsic Amplitude: 3.375 mV
Lead Channel Sensing Intrinsic Amplitude: 2.25 mV
Lead Channel Sensing Intrinsic Amplitude: 2.25 mV
Lead Channel Sensing Intrinsic Amplitude: 3.375 mV
Lead Channel Sensing Intrinsic Amplitude: 3.375 mV
Lead Channel Setting Pacing Amplitude: 2 V
Lead Channel Setting Pacing Amplitude: 4.5 V
Lead Channel Setting Pacing Amplitude: 2 V
Lead Channel Setting Pacing Amplitude: 4.5 V
Lead Channel Setting Pacing Pulse Width: 1 ms
Lead Channel Setting Pacing Pulse Width: 1 ms
Lead Channel Setting Sensing Sensitivity: 0.45 mV
Lead Channel Setting Sensing Sensitivity: 0.45 mV

## 2019-01-06 NOTE — Telephone Encounter (Signed)
Had called to discuss increased Afib burden (from 1% burden to > 16% and presented with Afib) and refer to Afib clinic to consider rhythm control, as she was previously paroxysmal.   No answer. LMOM for CB.      Legrand Como [address]" [place], PA-C 01/06/2019 4:22 PM

## 2019-01-06 NOTE — Telephone Encounter (Signed)
Pt returned [place] phone call. She requested that he gives her a call back before 10 am or after 11:30 am because she do have a doctors appointment at that time frame.

## 2019-01-07 NOTE — Telephone Encounter (Signed)
Spoke with patient. She reports that she suspects her thyroid levels are off, getting labs rechecked today at Dr. Keane Police office. Recently started taking Preservision vitamins, read that biotin can cause false thyroid test results.  Denies symptoms with persistent AF/A-flutter since 01/03/19. Some ShOB, but doesn't think it's more than her baseline. Reports compliance with medications, including Eliquis. Pt agreeable to AF Clinic appointment to discuss options. She denies additional questions or concerns at this time and thanked me for my call.

## 2019-01-08 ENCOUNTER — Other Ambulatory Visit: Payer: Self-pay

## 2019-01-08 ENCOUNTER — Ambulatory Visit (HOSPITAL_COMMUNITY)
Admission: RE | Admit: 2019-01-08 | Discharge: 2019-01-08 | Disposition: A | Discharge status: Home / Self Care | Payer: Medicare Other | Source: Ambulatory Visit | Attending: Physician Assistant | Admitting: Physician Assistant

## 2019-01-08 ENCOUNTER — Other Ambulatory Visit (HOSPITAL_COMMUNITY): Payer: Self-pay | Admitting: *Deleted

## 2019-01-08 DIAGNOSIS — I480 Paroxysmal atrial fibrillation: Secondary | ICD-10-CM

## 2019-01-08 MED ORDER — APIXABAN 5 MG PO TABS: 5.0000 mg | ORAL_TABLET | Freq: Two times a day (BID) | ORAL | 6 refills | Status: DC

## 2019-01-08 NOTE — Progress Notes (Signed)
Electrophysiology TeleHealth Note   Due to national recommendations of social distancing due to [place] 19, Audio/video telehealth visit is felt to be most appropriate for this patient at this time.  See consent below from today for patient consent regarding telehealth for the Atrial Fibrillation Clinic. Consent obtained verbally.   Date:  01/08/2019   ID:  Nicole Hampton, Nicole Hampton 08/05/1945, MRN [id number]  Location: home  Provider location: [address] [place],  [address] Evaluation Performed: Follow up  PCP:  Shon Baton, MD  Primary Cardiologist:  Dr Selena Batten Primary Electrophysiologist: Dr Rayann Heman   CC: Follow up for atrial fibrillation   History of Present Illness: Nicole Hampton is a 73 y.o. female who presents via audio/video conferencing for a telehealth visit today. Patient has history of VT and VF arrest s/p ICD, paroxysmal afib, HLD, HTN, CAD s/p CABG, and MVR. Patient was noted to be in persistent AF since 01/03/19. She reports that she has been unaware of her arrhythmia with minimal fatigue. She had recently started a supplement called Preservision for eye health but that is the only change recently. She is no longer taking it. No other triggers. She denies snoring or alcohol use.  Today, she denies symptoms of palpitations, chest pain, shortness of breath, orthopnea, PND, lower extremity edema, claudication, dizziness, presyncope, syncope, bleeding, or neurologic sequela. The patient is tolerating medications without difficulties and is otherwise without complaint today.   she denies symptoms of cough, fevers, chills, or new SOB worrisome for COVID 19.     Atrial Fibrillation Risk Factors:  she does not have symptoms or diagnosis of sleep apnea. she does not have a history of alcohol use. The patient does not have a history of early familial atrial fibrillation or other arrhythmias.  she has a BMI of There is no height or weight on file to calculate BMI.. There  were no vitals filed for this visit.  Past Medical History:  Diagnosis Date  . A-fib ([place])   . BCC (basal cell carcinoma)    SEES DERM.  [name] Hyperlipidemia    WITH ELEVATED LDL/HDL CLEAN CORS 2006. DR. Norville Haggard  . Hypertension   . Hypokalemia    CHRONIC  . Hypothyroidism   . Lactose intolerance   . OA (osteoarthritis)    HANDS AND FEET  . Pancreatitis 1982   GB  . S/P cholecystectomy    OPEN  . 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 Vertigo    RECURRENT   Past Surgical History:  Procedure Laterality Date  . CESAREAN SECTION     x1  . ICD GENERATOR CHANGE  05/25/2014   MDT Gwyneth Revels S DR ICD implanted by Dr Rosita Fire (secondary prevention)  . 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 OPEN HEART SURGERY WITH DEFIB  2006   1996     Current Outpatient Medications  Medication Sig Dispense Refill  . aMILoride (MIDAMOR) 5 MG tablet Take 5 mg by mouth daily.    [name] apixaban (ELIQUIS) 2.5 MG TABS tablet Take 2.5 mg by mouth 2 (two) times daily.    . Calcium Carbonate-Vitamin D (CALTRATE 600+D PO) Take 2 tablets by mouth daily.     [name] estradiol (ESTRACE) 1 MG tablet Take 1 mg by mouth daily.    [name] ezetimibe (ZETIA) 10 MG tablet Take 10 mg by mouth daily.    [name]  Fexofenadine HCl (ALLEGRA ALLERGY PO) Take 1 tablet by mouth daily as needed.     . fluticasone (FLONASE) 50 MCG/ACT nasal spray Place 2 sprays into both nostrils daily as needed for allergies.     [name] levothyroxine (SYNTHROID) 25 MCG tablet Take 25 mcg by mouth daily before breakfast.    . Magnesium 250 MG TABS Take 1 tablet by mouth daily.    . meclizine (ANTIVERT) 12.5 MG tablet Take 12.5 mg by mouth 3 (three) times daily as needed for dizziness.    . Multiple Vitamin (MULTIVITAMIN) tablet Take 1 tablet by mouth daily.    [name] omega-3 acid ethyl esters (LOVAZA) 1 g capsule Take 2 g by mouth daily.    . pindolol (VISKEN) 5 MG  tablet TAKE ONE TABLET DAILY 90 tablet 1  . potassium chloride (MICRO-K) 10 MEQ CR capsule Take 4 tablets by mouth in the morning and 3 tablets in the evening    . temazepam (RESTORIL) 7.5 MG capsule Take 7.5 mg by mouth at bedtime as needed for sleep.      No current facility-administered medications for this encounter.     Allergies:   Cefuroxime axetil, Codeine, Morphine, Quinidine gluconate, Quinidine, and Latex   Social History:  The patient  reports that she quit smoking about 43 years ago. She has never used smokeless tobacco. She reports current alcohol use. She reports that she does not use drugs.   Family History:  The patient's  family history includes ALS in her sister; Arthritis (age of onset: 82) in her mother; Hypertension (age of onset: 40) in her father; Macular degeneration in her brother; Pancreatic cancer in her brother.    ROS:  Please see the history of present illness.   All other systems are personally reviewed and negative.   Exam: Well appearing, alert and conversant, regular work of breathing,  good skin color  Recent Labs: No results found for requested labs within last 8760 hours.  personally reviewed    Other studies personally reviewed: Additional studies/ records that were reviewed today include: Epic notes    ASSESSMENT AND PLAN:  1.  Paroxysmal atrial fibrillation Low burden previously. Has been persistent since 6/12.  Discussed her stroke risk as well as the risks and benefits of anticoagulation. Will increase her Eliquis to 5 mg BID. Patient in agreement with this plan. If patient remains persistently in AF on follow up after 3 weeks of anticoagulation, will consider DCCV.  This patients CHA2DS2-VASc Score and unadjusted Ischemic Stroke Rate (% per year) is equal to 4.8 % stroke rate/year from a score of 4  Above score calculated as 1 point each if present [CHF, HTN, DM, Vascular=MI/PAD/Aortic Plaque, Age if 65-74, or Female] Above score  calculated as 2 points each if present [Age > 75, or Stroke/TIA/TE]  2. HTN No changes today.  3. CAD S/p CABG 2006. No anginal symptoms. Continue present therapy.  4. VF arrest/NSVT S/p ICD. Followed by Dr Rayann Heman and device clinic.    COVID screen The patient does not have any symptoms that suggest any further testing/ screening at this time.  Social distancing reinforced today.    Follow-up in AF clinic in 3 weeks.  Current medicines are reviewed at length with the patient today.   The patient does not have concerns regarding her medicines.  The following changes were made today:  Increase Eliquis  Labs/ tests ordered today include:  No orders of the defined types were placed in this encounter.  Patient Risk:  after full review of this patients clinical status, I feel that they are at moderate risk at this time.   Today, I have spent 24 minutes with the patient with telehealth technology discussing atrial fibrillation, lifestyle, and anticoagulation.    Gwenlyn Perking PA-C 01/08/2019 1:59 PM  Afib [facility] [address] [place], [place] [address] [phone]   I hereby voluntarily request, consent and authorize the [facility] and its employed or contracted physicians, physician assistants, nurse practitioners or other licensed health care professionals (the Practitioner), to provide me with telemedicine health care services (the "Services") as deemed necessary by the treating Practitioner. I acknowledge and consent to receive the Services by the Practitioner via telemedicine. I understand that the telemedicine visit will involve communicating with the Practitioner through live audiovisual communication technology and the disclosure of certain medical information by electronic transmission. I acknowledge that I have been given the opportunity to request an in-person assessment or other available alternative prior to the telemedicine  visit and am voluntarily participating in the telemedicine visit.   I understand that I have the right to withhold or withdraw my consent to the use of telemedicine in the course of my care at any time, without affecting my right to future care or treatment, and that the Practitioner or I may terminate the telemedicine visit at any time. I understand that I have the right to inspect all information obtained and/or recorded in the course of the telemedicine visit and may receive copies of available information for a reasonable fee.  I understand that some of the potential risks of receiving the Services via telemedicine include:   Delay or interruption in medical evaluation due to technological equipment failure or disruption;  Information transmitted may not be sufficient (e.g. poor resolution of images) to allow for appropriate medical decision making by the Practitioner; and/or  In rare instances, security protocols could fail, causing a breach of personal health information.   Furthermore, I acknowledge that it is my responsibility to provide information about my medical history, conditions and care that is complete and accurate to the best of my ability. I acknowledge that Practitioner's advice, recommendations, and/or decision may be based on factors not within their control, such as incomplete or inaccurate data provided by me or distortions of diagnostic images or specimens that may result from electronic transmissions. I understand that the practice of medicine is not an exact science and that Practitioner makes no warranties or guarantees regarding treatment outcomes. I acknowledge that I will receive a copy of this consent concurrently upon execution via email to the email address I last provided but may also request a printed copy by calling the office of the [facility].  I understand that my insurance will be billed for this visit.   I have read or had this consent read to me.   I understand the contents of this consent, which adequately explains the benefits and risks of the Services being provided via telemedicine.  I have been provided ample opportunity to ask questions regarding this consent and the Services and have had my questions answered to my satisfaction.  I give my informed consent for the services to be provided through the use of telemedicine in my medical care  By participating in this telemedicine visit I agree to the above.

## 2019-01-13 ENCOUNTER — Encounter: Payer: Self-pay | Admitting: Cardiology

## 2019-01-13 NOTE — Progress Notes (Signed)
Remote ICD transmission.   

## 2019-01-29 ENCOUNTER — Encounter (HOSPITAL_COMMUNITY): Payer: Self-pay | Admitting: Physician Assistant

## 2019-01-29 ENCOUNTER — Other Ambulatory Visit: Payer: Self-pay

## 2019-01-29 ENCOUNTER — Ambulatory Visit (HOSPITAL_COMMUNITY)
Admission: RE | Admit: 2019-01-29 | Discharge: 2019-01-29 | Disposition: A | Discharge status: Home / Self Care | Payer: Medicare Other | Source: Ambulatory Visit | Attending: Physician Assistant | Admitting: Physician Assistant

## 2019-01-29 VITALS — BP 118/76 | HR 94 | Ht 62.0 in | Wt 118.0 lb

## 2019-01-29 DIAGNOSIS — Z85828 Personal history of other malignant neoplasm of skin: Secondary | ICD-10-CM | POA: Diagnosis not present

## 2019-01-29 DIAGNOSIS — E039 Hypothyroidism, unspecified: Secondary | ICD-10-CM | POA: Insufficient documentation

## 2019-01-29 DIAGNOSIS — Z91040 Latex allergy status: Secondary | ICD-10-CM | POA: Insufficient documentation

## 2019-01-29 DIAGNOSIS — Z800 Family history of malignant neoplasm of digestive organs: Secondary | ICD-10-CM | POA: Diagnosis not present

## 2019-01-29 DIAGNOSIS — I4819 Other persistent atrial fibrillation: Secondary | ICD-10-CM | POA: Diagnosis not present

## 2019-01-29 DIAGNOSIS — Z951 Presence of aortocoronary bypass graft: Secondary | ICD-10-CM | POA: Diagnosis not present

## 2019-01-29 DIAGNOSIS — Z8249 Family history of ischemic heart disease and other diseases of the circulatory system: Secondary | ICD-10-CM | POA: Insufficient documentation

## 2019-01-29 DIAGNOSIS — Z7901 Long term (current) use of anticoagulants: Secondary | ICD-10-CM | POA: Insufficient documentation

## 2019-01-29 DIAGNOSIS — I2510 Atherosclerotic heart disease of native coronary artery without angina pectoris: Secondary | ICD-10-CM | POA: Diagnosis not present

## 2019-01-29 DIAGNOSIS — Z87891 Personal history of nicotine dependence: Secondary | ICD-10-CM | POA: Insufficient documentation

## 2019-01-29 DIAGNOSIS — Z881 Allergy status to other antibiotic agents status: Secondary | ICD-10-CM | POA: Diagnosis not present

## 2019-01-29 DIAGNOSIS — Z885 Allergy status to narcotic agent status: Secondary | ICD-10-CM | POA: Insufficient documentation

## 2019-01-29 DIAGNOSIS — Z95810 Presence of automatic (implantable) cardiac defibrillator: Secondary | ICD-10-CM | POA: Diagnosis not present

## 2019-01-29 DIAGNOSIS — Z820 Family history of epilepsy and other diseases of the nervous system: Secondary | ICD-10-CM | POA: Diagnosis not present

## 2019-01-29 DIAGNOSIS — Z79890 Hormone replacement therapy (postmenopausal): Secondary | ICD-10-CM | POA: Insufficient documentation

## 2019-01-29 DIAGNOSIS — E785 Hyperlipidemia, unspecified: Secondary | ICD-10-CM | POA: Insufficient documentation

## 2019-01-29 DIAGNOSIS — Z79899 Other long term (current) drug therapy: Secondary | ICD-10-CM | POA: Insufficient documentation

## 2019-01-29 DIAGNOSIS — Z888 Allergy status to other drugs, medicaments and biological substances status: Secondary | ICD-10-CM | POA: Insufficient documentation

## 2019-01-29 DIAGNOSIS — I10 Essential (primary) hypertension: Secondary | ICD-10-CM | POA: Insufficient documentation

## 2019-01-29 DIAGNOSIS — I480 Paroxysmal atrial fibrillation: Secondary | ICD-10-CM | POA: Diagnosis present

## 2019-01-29 LAB — CBC
HCT: 38.3 % (ref 36.0–46.0)
Hemoglobin: 12.4 g/dL (ref 12.0–15.0)
MCH: 30.2 pg (ref 26.0–34.0)
MCHC: 32.4 g/dL (ref 30.0–36.0)
MCV: 93.4 fL (ref 80.0–100.0)
Platelets: 223 10*3/uL (ref 150–400)
RBC: 4.1 MIL/uL (ref 3.87–5.11)
RDW: 12.1 % (ref 11.5–15.5)
WBC: 4.6 10*3/uL (ref 4.0–10.5)
nRBC: 0 % (ref 0.0–0.2)

## 2019-01-29 LAB — BASIC METABOLIC PANEL
Anion gap: 10 (ref 5–15)
BUN: 17 mg/dL (ref 8–23)
CO2: 26 mmol/L (ref 22–32)
Calcium: 8.8 mg/dL — ABNORMAL LOW (ref 8.9–10.3)
Chloride: 105 mmol/L (ref 98–111)
Creatinine, Ser: 0.68 mg/dL (ref 0.44–1.00)
GFR calc Af Amer: >60 mL/min (ref >60)
GFR calc non Af Amer: >60 mL/min (ref >60)
Glucose, Bld: 94 mg/dL (ref 70–99)
Potassium: 4.6 mmol/L (ref 3.5–5.1)
Sodium: 141 mmol/L (ref 135–145)

## 2019-01-29 NOTE — Patient Instructions (Addendum)
Cardioversion scheduled for Tuesday, July 21st  - Arrive at the [facility] and go to admitting at 1:30PM  -Do not eat or drink anything after midnight the night prior to your procedure.  - Take all your morning medication with a sip of water prior to arrival.  - You will not be able to drive home after your procedure.

## 2019-01-29 NOTE — H&P (View-Only) (Signed)
Primary Care Physician: Shon Baton, MD Primary Cardiologist: Dr Selena Batten  Primary Electrophysiologist: Dr Rayann Heman Referring Physician: [facility] device clinic   Nicole Hampton is a 73 y.o. female with a history of VT and VF arrest s/p ICD, paroxysmal afib, HLD, HTN, CAD s/p CABG, and MVR. Patient was noted to be in persistent AF since 01/03/19. She reports that she has been unaware of her arrhythmia with minimal fatigue. She had recently started a supplement called Preservision for eye health but that is the only change recently. She is no longer taking it. No other triggers. She denies snoring or alcohol use.  Since her last visit, she reports that she has felt more fatigued and SOB, especially with exertion. She remains in afib today. She has been on Eliquis 5 mg BID with no missed doses.   Today, she denies symptoms of palpitations, chest pain, orthopnea, PND, lower extremity edema, dizziness, presyncope, syncope, snoring, daytime somnolence, bleeding, or neurologic sequela. The patient is tolerating medications without difficulties and is otherwise without complaint today.    Atrial Fibrillation Risk Factors:  she does not have symptoms or diagnosis of sleep apnea. she does not have a history of alcohol use. The patient does not have a history of early familial atrial fibrillation or other arrhythmias.  she has a BMI of Body mass index is 21.58 kg/m.[name] Filed Weights   01/29/19 1333  Weight: 53.5 kg    Family History  Problem Relation Age of Onset  . Arthritis Mother 32       DROWNING WITH HURRICANE [name]  . Hypertension Father 38       DROWNING WITH HURRICANE [name]  . ALS Sister   . Pancreatic cancer Brother   . Macular degeneration Brother      Atrial Fibrillation Management history:  Previous antiarrhythmic drugs: none Previous cardioversions: none Previous ablations: none CHADS2VASC score: 4 Anticoagulation history: Eliquis   Past Medical History:  Diagnosis  Date  . A-fib ([place])   . BCC (basal cell carcinoma)    SEES DERM.  [name] Hyperlipidemia    WITH ELEVATED LDL/HDL CLEAN CORS 2006. DR. Norville Haggard  . Hypertension   . Hypokalemia    CHRONIC  . Hypothyroidism   . Lactose intolerance   . OA (osteoarthritis)    HANDS AND FEET  . Pancreatitis 1982   GB  . S/P cholecystectomy    OPEN  . Sudden cardiac death ([place]) [place]. AICD  . Vertigo    RECURRENT   Past Surgical History:  Procedure Laterality Date  . CESAREAN SECTION     x1  . ICD GENERATOR CHANGE  05/25/2014   MDT Gwyneth Revels S DR ICD implanted by Dr Rosita Fire (secondary prevention)  . 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 OPEN HEART SURGERY WITH DEFIB  2006   1996    Current Outpatient Medications  Medication Sig Dispense Refill  . aMILoride (MIDAMOR) 5 MG tablet Take 5 mg by mouth daily.    [name] apixaban (ELIQUIS) 5 MG TABS tablet Take 1 tablet (5 mg total) by mouth 2 (two) times daily. 60 tablet 6  . Calcium Carbonate-Vitamin D (CALTRATE 600+D PO) Take 2 tablets by mouth daily.     [name] estradiol (ESTRACE) 1 MG tablet Take 1 mg by mouth daily.    [name] ezetimibe (ZETIA) 10 MG tablet Take 10 mg by mouth daily.    [name]  Fexofenadine HCl (ALLEGRA ALLERGY PO) Take 1 tablet by mouth daily as needed.     . fluticasone (FLONASE) 50 MCG/ACT nasal spray Place 2 sprays into both nostrils daily as needed for allergies.     [name] levothyroxine (SYNTHROID) 25 MCG tablet Take 25 mcg by mouth daily before breakfast.    . Magnesium 250 MG TABS Take 1 tablet by mouth daily.    . meclizine (ANTIVERT) 12.5 MG tablet Take 12.5 mg by mouth 3 (three) times daily as needed for dizziness.    . Multiple Vitamin (MULTIVITAMIN) tablet Take 1 tablet by mouth daily.    [name] omega-3 acid ethyl esters (LOVAZA) 1 g capsule Take 2 g by mouth daily.    . pindolol (VISKEN) 5 MG tablet TAKE ONE TABLET DAILY 90 tablet 1  .  potassium chloride (MICRO-K) 10 MEQ CR capsule Take 4 tablets by mouth in the morning and 3 tablets in the evening    . temazepam (RESTORIL) 7.5 MG capsule Take 7.5 mg by mouth at bedtime as needed for sleep.      No current facility-administered medications for this encounter.     Allergies  Allergen Reactions  . Cefuroxime Axetil Palpitations  . Codeine Nausea And Vomiting and Other (See Comments)    Gi Upset   . Morphine Other (See Comments)    unknown   . Quinidine Gluconate Other (See Comments)    Positive ANA, tachycardia  . Quinidine     Positive ANA, tachycardia  . Latex Rash    Social History   Socioeconomic History  . Marital status: Married    Spouse name: [place]  . Number of children: 2  . Years of education: Not on file  . Highest education level: Not on file  Occupational History  . Occupation: [employer]  Social Needs  . Financial resource strain: Not on file  . Food insecurity    Worry: Not on file    Inability: Not on file  . Transportation needs    Medical: Not on file    Non-medical: Not on file  Tobacco Use  . Smoking status: Former Smoker    Quit date: 06/23/1975    Years since quitting: 43.6  . Smokeless tobacco: Never Used  Substance and Sexual Activity  . Alcohol use: Yes  . Drug use: No  . Sexual activity: Not on file  Lifestyle  . Physical activity    Days per week: Not on file    Minutes per session: Not on file  . Stress: Not on file  Relationships  . Social Herbalist on phone: Not on file    Gets together: Not on file    Attends religious service: Not on file    Active member of club or organization: Not on file    Attends meetings of clubs or organizations: Not on file    Relationship status: Not on file  . Intimate partner violence    Fear of current or ex partner: Not on file    Emotionally abused: Not on file    Physically abused: Not on file    Forced sexual activity: Not on file  Other Topics  Concern  . Not on file  Social History Narrative  . Not on file     ROS- All systems are reviewed and negative except as per the HPI above.  Physical Exam: Vitals:   01/29/19 1333  BP: 118/76  Pulse: 94  Weight: 53.5 kg  Height: 5'  2" (1.575 m)    GEN- The patient is well appearing elderly female, alert and oriented x 3 today.   Head- normocephalic, atraumatic Eyes-  Sclera clear, conjunctiva pink Ears- hearing intact Oropharynx- clear Neck- supple  Lungs- Clear to ausculation bilaterally, normal work of breathing Heart- irregular rate and rhythm, no murmurs, rubs or gallops  GI- soft, NT, ND, + BS Extremities- no clubbing, cyanosis, or edema MS- no significant deformity or atrophy Skin- no rash or lesion Psych- euthymic mood, full affect Neuro- strength and sensation are intact  Wt Readings from Last 3 Encounters:  01/29/19 53.5 kg  12/09/18 53.5 kg  06/05/18 54 kg    EKG today demonstrates afib HR 94, PVCs, NST, QRS 84, QTc 430  Echo 12/09/18 demonstrated  1. The left ventricle has normal systolic function with an ejection fraction of 60-65%. The cavity size was normal. Left ventricular diastolic Doppler parameters are indeterminate.  2. The right ventricle has normal systolic function. The cavity was normal.  3. Left atrial size was mildly dilated.  4. Mild thickening of the mitral valve leaflet. Mild mitral valve stenosis.  5. The tricuspid valve is grossly normal.  6. The aortic valve is tricuspid. Mild thickening of the aortic valve.  7. Akinesis of the basal inferior wall with overall normal LV function; s/p MV repair with mean gradient 11 mmHg; MVA 1.8 cm2 by pressure halftime; likely mild MS; mild LAE; mild TR with mild pulmonary hypertension.  Epic records are reviewed at length today  Assessment and Plan:  1. Persistent atrial fibrillation The patient has persistent atrial fibrillation.   We discussed therapeutic options including DCCV. After  discussing the risks and benefits, patient would like to pursue DCCV at this time.  Patient reports no missed doses of anticoagulation. Check Bmet/CBC We also discussed possibility of starting AAD therapy should she have ERAF. She has had a low AF burden prior to the present episode and she would like to avoid daily medication if possible.   This patients CHA2DS2-VASc Score and unadjusted Ischemic Stroke Rate (% per year) is equal to 4.8 % stroke rate/year from a score of 4  Above score calculated as 1 point each if present [CHF, HTN, DM, Vascular=MI/PAD/Aortic Plaque, Age if 65-74, or Female] Above score calculated as 2 points each if present [Age > 75, or Stroke/TIA/TE]   2. HTN Stable, no changes today.  3. CAD S/p CABG 2006. No anginal symptoms. Continue present therapy and risk factor modification.  4. VF arrest/NSVT S/p ICD, followed by Dr Rayann Heman and the device clinic.   Follow up one week post DCCV.   [facility] [address] [place], [place] [address] [phone] 01/29/2019 3:24 PM

## 2019-01-29 NOTE — Progress Notes (Signed)
Primary Care Physician: Shon Baton, MD Primary Cardiologist: Dr Selena Batten  Primary Electrophysiologist: Dr Rayann Heman Referring Physician: [facility] device clinic   Nicole Hampton is a 73 y.o. female with a history of VT and VF arrest s/p ICD, paroxysmal afib, HLD, HTN, CAD s/p CABG, and MVR. Patient was noted to be in persistent AF since 01/03/19. She reports that she has been unaware of her arrhythmia with minimal fatigue. She had recently started a supplement called Preservision for eye health but that is the only change recently. She is no longer taking it. No other triggers. She denies snoring or alcohol use.  Since her last visit, she reports that she has felt more fatigued and SOB, especially with exertion. She remains in afib today. She has been on Eliquis 5 mg BID with no missed doses.   Today, she denies symptoms of palpitations, chest pain, orthopnea, PND, lower extremity edema, dizziness, presyncope, syncope, snoring, daytime somnolence, bleeding, or neurologic sequela. The patient is tolerating medications without difficulties and is otherwise without complaint today.    Atrial Fibrillation Risk Factors:  she does not have symptoms or diagnosis of sleep apnea. she does not have a history of alcohol use. The patient does not have a history of early familial atrial fibrillation or other arrhythmias.  she has a BMI of Body mass index is 21.58 kg/m.[name] Filed Weights   01/29/19 1333  Weight: 53.5 kg    Family History  Problem Relation Age of Onset  . Arthritis Mother 61       DROWNING WITH HURRICANE [name]  . Hypertension Father 70       DROWNING WITH HURRICANE [name]  . ALS Sister   . Pancreatic cancer Brother   . Macular degeneration Brother      Atrial Fibrillation Management history:  Previous antiarrhythmic drugs: none Previous cardioversions: none Previous ablations: none CHADS2VASC score: 4 Anticoagulation history: Eliquis   Past Medical History:  Diagnosis  Date  . A-fib ([place])   . BCC (basal cell carcinoma)    SEES DERM.  [name] Hyperlipidemia    WITH ELEVATED LDL/HDL CLEAN CORS 2006. DR. Norville Haggard  . Hypertension   . Hypokalemia    CHRONIC  . Hypothyroidism   . Lactose intolerance   . OA (osteoarthritis)    HANDS AND FEET  . Pancreatitis 1982   GB  . S/P cholecystectomy    OPEN  . Sudden cardiac death ([place]) [place]. AICD  . Vertigo    RECURRENT   Past Surgical History:  Procedure Laterality Date  . CESAREAN SECTION     x1  . ICD GENERATOR CHANGE  05/25/2014   MDT Gwyneth Revels S DR ICD implanted by Dr Rosita Fire (secondary prevention)  . 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 OPEN HEART SURGERY WITH DEFIB  2006   1996    Current Outpatient Medications  Medication Sig Dispense Refill  . aMILoride (MIDAMOR) 5 MG tablet Take 5 mg by mouth daily.    [name] apixaban (ELIQUIS) 5 MG TABS tablet Take 1 tablet (5 mg total) by mouth 2 (two) times daily. 60 tablet 6  . Calcium Carbonate-Vitamin D (CALTRATE 600+D PO) Take 2 tablets by mouth daily.     [name] estradiol (ESTRACE) 1 MG tablet Take 1 mg by mouth daily.    [name] ezetimibe (ZETIA) 10 MG tablet Take 10 mg by mouth daily.    [name]  Fexofenadine HCl (ALLEGRA ALLERGY PO) Take 1 tablet by mouth daily as needed.     . fluticasone (FLONASE) 50 MCG/ACT nasal spray Place 2 sprays into both nostrils daily as needed for allergies.     [name] levothyroxine (SYNTHROID) 25 MCG tablet Take 25 mcg by mouth daily before breakfast.    . Magnesium 250 MG TABS Take 1 tablet by mouth daily.    . meclizine (ANTIVERT) 12.5 MG tablet Take 12.5 mg by mouth 3 (three) times daily as needed for dizziness.    . Multiple Vitamin (MULTIVITAMIN) tablet Take 1 tablet by mouth daily.    [name] omega-3 acid ethyl esters (LOVAZA) 1 g capsule Take 2 g by mouth daily.    . pindolol (VISKEN) 5 MG tablet TAKE ONE TABLET DAILY 90 tablet 1  .  potassium chloride (MICRO-K) 10 MEQ CR capsule Take 4 tablets by mouth in the morning and 3 tablets in the evening    . temazepam (RESTORIL) 7.5 MG capsule Take 7.5 mg by mouth at bedtime as needed for sleep.      No current facility-administered medications for this encounter.     Allergies  Allergen Reactions  . Cefuroxime Axetil Palpitations  . Codeine Nausea And Vomiting and Other (See Comments)    Gi Upset   . Morphine Other (See Comments)    unknown   . Quinidine Gluconate Other (See Comments)    Positive ANA, tachycardia  . Quinidine     Positive ANA, tachycardia  . Latex Rash    Social History   Socioeconomic History  . Marital status: Married    Spouse name: [name]  . Number of children: 2  . Years of education: Not on file  . Highest education level: Not on file  Occupational History  . Occupation: [employer]  Social Needs  . Financial resource strain: Not on file  . Food insecurity    Worry: Not on file    Inability: Not on file  . Transportation needs    Medical: Not on file    Non-medical: Not on file  Tobacco Use  . Smoking status: Former Smoker    Quit date: 06/23/1975    Years since quitting: 43.6  . Smokeless tobacco: Never Used  Substance and Sexual Activity  . Alcohol use: Yes  . Drug use: No  . Sexual activity: Not on file  Lifestyle  . Physical activity    Days per week: Not on file    Minutes per session: Not on file  . Stress: Not on file  Relationships  . Social Herbalist on phone: Not on file    Gets together: Not on file    Attends religious service: Not on file    Active member of club or organization: Not on file    Attends meetings of clubs or organizations: Not on file    Relationship status: Not on file  . Intimate partner violence    Fear of current or ex partner: Not on file    Emotionally abused: Not on file    Physically abused: Not on file    Forced sexual activity: Not on file  Other Topics  Concern  . Not on file  Social History Narrative  . Not on file     ROS- All systems are reviewed and negative except as per the HPI above.  Physical Exam: Vitals:   01/29/19 1333  BP: 118/76  Pulse: 94  Weight: 53.5 kg  Height: 5'  2" (1.575 m)    GEN- The patient is well appearing elderly female, alert and oriented x 3 today.   Head- normocephalic, atraumatic Eyes-  Sclera clear, conjunctiva pink Ears- hearing intact Oropharynx- clear Neck- supple  Lungs- Clear to ausculation bilaterally, normal work of breathing Heart- irregular rate and rhythm, no murmurs, rubs or gallops  GI- soft, NT, ND, + BS Extremities- no clubbing, cyanosis, or edema MS- no significant deformity or atrophy Skin- no rash or lesion Psych- euthymic mood, full affect Neuro- strength and sensation are intact  Wt Readings from Last 3 Encounters:  01/29/19 53.5 kg  12/09/18 53.5 kg  06/05/18 54 kg    EKG today demonstrates afib HR 94, PVCs, NST, QRS 84, QTc 430  Echo 12/09/18 demonstrated  1. The left ventricle has normal systolic function with an ejection fraction of 60-65%. The cavity size was normal. Left ventricular diastolic Doppler parameters are indeterminate.  2. The right ventricle has normal systolic function. The cavity was normal.  3. Left atrial size was mildly dilated.  4. Mild thickening of the mitral valve leaflet. Mild mitral valve stenosis.  5. The tricuspid valve is grossly normal.  6. The aortic valve is tricuspid. Mild thickening of the aortic valve.  7. Akinesis of the basal inferior wall with overall normal LV function; s/p MV repair with mean gradient 11 mmHg; MVA 1.8 cm2 by pressure halftime; likely mild MS; mild LAE; mild TR with mild pulmonary hypertension.  Epic records are reviewed at length today  Assessment and Plan:  1. Persistent atrial fibrillation The patient has persistent atrial fibrillation.   We discussed therapeutic options including DCCV. After  discussing the risks and benefits, patient would like to pursue DCCV at this time.  Patient reports no missed doses of anticoagulation. Check Bmet/CBC We also discussed possibility of starting AAD therapy should she have ERAF. She has had a low AF burden prior to the present episode and she would like to avoid daily medication if possible.   This patients CHA2DS2-VASc Score and unadjusted Ischemic Stroke Rate (% per year) is equal to 4.8 % stroke rate/year from a score of 4  Above score calculated as 1 point each if present [CHF, HTN, DM, Vascular=MI/PAD/Aortic Plaque, Age if 65-74, or Female] Above score calculated as 2 points each if present [Age > 75, or Stroke/TIA/TE]   2. HTN Stable, no changes today.  3. CAD S/p CABG 2006. No anginal symptoms. Continue present therapy and risk factor modification.  4. VF arrest/NSVT S/p ICD, followed by Dr Rayann Heman and the device clinic.   Follow up one week post DCCV.   [facility] [address] [place], [place] [address] [phone] 01/29/2019 3:24 PM

## 2019-02-07 ENCOUNTER — Other Ambulatory Visit (HOSPITAL_COMMUNITY)
Admission: RE | Admit: 2019-02-07 | Discharge: 2019-02-07 | Disposition: A | Discharge status: Home / Self Care | Payer: Medicare Other | Source: Ambulatory Visit | Attending: Internal Medicine | Admitting: Internal Medicine

## 2019-02-07 DIAGNOSIS — Z1159 Encounter for screening for other viral diseases: Secondary | ICD-10-CM | POA: Diagnosis present

## 2019-02-07 LAB — SARS CORONAVIRUS 2 (TAT 6-24 HRS): SARS Coronavirus 2: NEGATIVE

## 2019-02-11 ENCOUNTER — Other Ambulatory Visit: Payer: Self-pay

## 2019-02-11 ENCOUNTER — Ambulatory Visit (HOSPITAL_COMMUNITY): Payer: Medicare Other | Admitting: Registered Nurse

## 2019-02-11 ENCOUNTER — Encounter (HOSPITAL_COMMUNITY): Payer: Self-pay | Admitting: *Deleted

## 2019-02-11 ENCOUNTER — Ambulatory Visit (HOSPITAL_COMMUNITY)
Admission: RE | Admit: 2019-02-11 | Discharge: 2019-02-11 | Disposition: A | Discharge status: Home / Self Care | Payer: Medicare Other | Attending: Internal Medicine | Admitting: Internal Medicine

## 2019-02-11 ENCOUNTER — Encounter (HOSPITAL_COMMUNITY)
Admission: RE | Disposition: A | Discharge status: Home / Self Care | Payer: Medicare Other | Source: Home / Self Care | Attending: Internal Medicine

## 2019-02-11 DIAGNOSIS — E039 Hypothyroidism, unspecified: Secondary | ICD-10-CM | POA: Diagnosis not present

## 2019-02-11 DIAGNOSIS — Z885 Allergy status to narcotic agent status: Secondary | ICD-10-CM | POA: Diagnosis not present

## 2019-02-11 DIAGNOSIS — Z95810 Presence of automatic (implantable) cardiac defibrillator: Secondary | ICD-10-CM | POA: Insufficient documentation

## 2019-02-11 DIAGNOSIS — I472 Ventricular tachycardia: Secondary | ICD-10-CM | POA: Insufficient documentation

## 2019-02-11 DIAGNOSIS — Z7901 Long term (current) use of anticoagulants: Secondary | ICD-10-CM | POA: Diagnosis not present

## 2019-02-11 DIAGNOSIS — Z79890 Hormone replacement therapy (postmenopausal): Secondary | ICD-10-CM | POA: Insufficient documentation

## 2019-02-11 DIAGNOSIS — I2510 Atherosclerotic heart disease of native coronary artery without angina pectoris: Secondary | ICD-10-CM | POA: Insufficient documentation

## 2019-02-11 DIAGNOSIS — Z881 Allergy status to other antibiotic agents status: Secondary | ICD-10-CM | POA: Diagnosis not present

## 2019-02-11 DIAGNOSIS — Z951 Presence of aortocoronary bypass graft: Secondary | ICD-10-CM | POA: Insufficient documentation

## 2019-02-11 DIAGNOSIS — I4901 Ventricular fibrillation: Secondary | ICD-10-CM | POA: Insufficient documentation

## 2019-02-11 DIAGNOSIS — E785 Hyperlipidemia, unspecified: Secondary | ICD-10-CM | POA: Diagnosis not present

## 2019-02-11 DIAGNOSIS — M1990 Unspecified osteoarthritis, unspecified site: Secondary | ICD-10-CM | POA: Diagnosis not present

## 2019-02-11 DIAGNOSIS — I4819 Other persistent atrial fibrillation: Secondary | ICD-10-CM | POA: Insufficient documentation

## 2019-02-11 DIAGNOSIS — Z79899 Other long term (current) drug therapy: Secondary | ICD-10-CM | POA: Insufficient documentation

## 2019-02-11 DIAGNOSIS — Z8249 Family history of ischemic heart disease and other diseases of the circulatory system: Secondary | ICD-10-CM | POA: Diagnosis not present

## 2019-02-11 DIAGNOSIS — Z87891 Personal history of nicotine dependence: Secondary | ICD-10-CM | POA: Diagnosis not present

## 2019-02-11 DIAGNOSIS — I10 Essential (primary) hypertension: Secondary | ICD-10-CM | POA: Insufficient documentation

## 2019-02-11 DIAGNOSIS — E119 Type 2 diabetes mellitus without complications: Secondary | ICD-10-CM | POA: Insufficient documentation

## 2019-02-11 DIAGNOSIS — I4891 Unspecified atrial fibrillation: Secondary | ICD-10-CM | POA: Diagnosis not present

## 2019-02-11 DIAGNOSIS — Z91040 Latex allergy status: Secondary | ICD-10-CM | POA: Insufficient documentation

## 2019-02-11 HISTORY — PX: CARDIOVERSION: SHX1299

## 2019-02-11 HISTORY — DX: Other complications of anesthesia, initial encounter: T88.59XA

## 2019-02-11 SURGERY — CARDIOVERSION
Anesthesia: General

## 2019-02-11 MED ORDER — LIDOCAINE HCL (CARDIAC) PF 100 MG/5ML IV SOSY
PREFILLED_SYRINGE | INTRAVENOUS | Status: DC | PRN
  Administered 2019-02-11: 60 mg via INTRAVENOUS

## 2019-02-11 MED ORDER — PROPOFOL 10 MG/ML IV BOLUS
INTRAVENOUS | Status: DC | PRN
  Administered 2019-02-11: 50 mg via INTRAVENOUS

## 2019-02-11 MED ORDER — SODIUM CHLORIDE 0.9 % IV SOLN
INTRAVENOUS | Status: DC | PRN
  Administered 2019-02-11: 15:00:00 via INTRAVENOUS

## 2019-02-11 NOTE — Transfer of Care (Signed)
Immediate Anesthesia Transfer of Care Note  Patient: Nicole Hampton  Procedure(s) Performed: CARDIOVERSION (N/A )  Patient Location: Endoscopy Unit  Anesthesia Type:General  Level of Consciousness: awake, alert  and oriented  Airway & Oxygen Therapy: Patient Spontanous Breathing  Post-op Assessment: Report given to RN and Post -op Vital signs reviewed and stable  Post vital signs: Reviewed and stable  Last Vitals:  Vitals Value Taken Time  BP 118/56 02/11/19 1528  Temp    Pulse 72 02/11/19 1528  Resp 15 02/11/19 1528  SpO2 93 % 02/11/19 1528    Last Pain:  Vitals:   02/11/19 1415  PainSc: 0-No pain         Complications: No apparent anesthesia complications

## 2019-02-11 NOTE — Anesthesia Procedure Notes (Signed)
Date/Time: 02/11/2019 3:22 PM Performed by: Jearld Pies, CRNA Pre-anesthesia Checklist: Patient identified, Emergency Drugs available, Suction available and Patient being monitored Patient Re-evaluated:Patient Re-evaluated prior to induction Oxygen Delivery Method: Ambu bag Preoxygenation: Pre-oxygenation with 100% oxygen

## 2019-02-11 NOTE — Interval H&P Note (Signed)
History and Physical Interval Note:  02/11/2019 2:46 PM  Nicole Hampton  has presented today for surgery, with the diagnosis of a-fib.  The various methods of treatment have been discussed with the patient and family. After consideration of risks, benefits and other options for treatment, the patient has consented to  Procedure(s): CARDIOVERSION (N/A) as a surgical intervention.  The patient's history has been reviewed, patient examined, no change in status, stable for surgery.  I have reviewed the patient's chart and labs.  Questions were answered to the patient's satisfaction.     Elouise Munroe

## 2019-02-11 NOTE — CV Procedure (Addendum)
Procedure: Electrical Cardioversion Indications:  Atrial Fibrillation  Procedure Details:  Consent: Risks of procedure as well as the alternatives and risks of each were explained to the (patient/caregiver).  Consent for procedure obtained.  Time Out: Verified patient identification, verified procedure, site/side was marked, verified correct patient position, special equipment/implants available, medications/allergies/relevent history reviewed, required imaging and test results available. PERFORMED.  Patient placed on cardiac monitor, pulse oximetry, supplemental oxygen as necessary.  Sedation given: propofol per anesthesia Pacer pads placed anterior and posterior chest.  Cardioverted 1 time(s).  Cardioversion with synchronized biphasic 120J shock.  Evaluation: Findings: Post procedure EKG shows: AP/VS paced rhythm Complications: None Patient did tolerate procedure well.  Time Spent Directly with the Patient:   30 minutes   Nicole Hampton 02/11/2019, 3:25 PM

## 2019-02-11 NOTE — Anesthesia Preprocedure Evaluation (Signed)
Anesthesia Evaluation  Patient identified by MRN, date of birth, ID band Patient awake    Reviewed: Allergy & Precautions, H&P , NPO status , Patient's Chart, lab work & pertinent test results  Airway Mallampati: II   Neck ROM: full    Dental   Pulmonary neg pulmonary ROS, former smoker,    breath sounds clear to auscultation       Cardiovascular hypertension, + dysrhythmias Atrial Fibrillation  Rhythm:irregular Rate:Normal     Neuro/Psych    GI/Hepatic   Endo/Other  Hypothyroidism   Renal/GU      Musculoskeletal  (+) Arthritis ,   Abdominal   Peds  Hematology   Anesthesia Other Findings   Reproductive/Obstetrics                             Anesthesia Physical Anesthesia Plan  ASA: III  Anesthesia Plan: General   Post-op Pain Management:    Induction: Intravenous  PONV Risk Score and Plan: 3 and Propofol infusion and Treatment may vary due to age or medical condition  Airway Management Planned: Mask  Additional Equipment:   Intra-op Plan:   Post-operative Plan:   Informed Consent: I have reviewed the patients History and Physical, chart, labs and discussed the procedure including the risks, benefits and alternatives for the proposed anesthesia with the patient or authorized representative who has indicated his/her understanding and acceptance.       Plan Discussed with: Anesthesiologist and CRNA  Anesthesia Plan Comments:         Anesthesia Quick Evaluation

## 2019-02-11 NOTE — Discharge Instructions (Signed)

## 2019-02-12 ENCOUNTER — Telehealth (HOSPITAL_COMMUNITY): Payer: Self-pay | Admitting: *Deleted

## 2019-02-12 NOTE — Anesthesia Postprocedure Evaluation (Signed)
Anesthesia Post Note  Patient: Nicole Hampton  Procedure(s) Performed: CARDIOVERSION (N/A )     Patient location during evaluation: Endoscopy Anesthesia Type: General Level of consciousness: awake and alert Pain management: pain level controlled Vital Signs Assessment: post-procedure vital signs reviewed and stable Respiratory status: spontaneous breathing, nonlabored ventilation, respiratory function stable and patient connected to nasal cannula oxygen Cardiovascular status: blood pressure returned to baseline and stable Postop Assessment: no apparent nausea or vomiting Anesthetic complications: no    Last Vitals:  Vitals:   02/11/19 1540 02/11/19 1550  BP: (!) 121/52 (!) 120/49  Pulse:    Resp: (!) 23 (!) 22  Temp:    SpO2: 97% 100%    Last Pain:  Vitals:   02/11/19 1550  TempSrc:   PainSc: 0-No pain                 Amando Ishikawa S

## 2019-02-12 NOTE — Telephone Encounter (Signed)
Transmission reviewed. Presenting rhythm Ap/Vs @ 92bpm as of 02/12/19 at 10:04. A-flutter episode noted on 02/11/19 beginning at 18:44, duration 11hr 21min. Routed to [place], Therapist, sports.

## 2019-02-12 NOTE — Telephone Encounter (Signed)
Pt notified of results of transmission. Feeling better this afternoon. Pt will call if frequent breakthrough otherwise will see at follow up next week. Pt verbalized understanding.

## 2019-02-12 NOTE — Telephone Encounter (Signed)
Patient called in stating she was up a lot of the night worried she was back in Af. She has sent a transmission to see if her device is showing any signs of afib. Will have device clinic pull her transmission.

## 2019-02-20 ENCOUNTER — Ambulatory Visit (HOSPITAL_COMMUNITY)
Admission: RE | Admit: 2019-02-20 | Discharge: 2019-02-20 | Disposition: A | Discharge status: Home / Self Care | Payer: Medicare Other | Source: Ambulatory Visit | Attending: Physician Assistant | Admitting: Physician Assistant

## 2019-02-20 ENCOUNTER — Encounter (HOSPITAL_COMMUNITY): Payer: Self-pay | Admitting: Physician Assistant

## 2019-02-20 ENCOUNTER — Other Ambulatory Visit: Payer: Self-pay

## 2019-02-20 VITALS — BP 116/62 | HR 73 | Ht 62.0 in | Wt 118.0 lb

## 2019-02-20 DIAGNOSIS — E876 Hypokalemia: Secondary | ICD-10-CM | POA: Insufficient documentation

## 2019-02-20 DIAGNOSIS — Z7901 Long term (current) use of anticoagulants: Secondary | ICD-10-CM | POA: Diagnosis not present

## 2019-02-20 DIAGNOSIS — M19049 Primary osteoarthritis, unspecified hand: Secondary | ICD-10-CM | POA: Diagnosis not present

## 2019-02-20 DIAGNOSIS — I2510 Atherosclerotic heart disease of native coronary artery without angina pectoris: Secondary | ICD-10-CM | POA: Diagnosis not present

## 2019-02-20 DIAGNOSIS — E039 Hypothyroidism, unspecified: Secondary | ICD-10-CM | POA: Insufficient documentation

## 2019-02-20 DIAGNOSIS — Z87891 Personal history of nicotine dependence: Secondary | ICD-10-CM | POA: Diagnosis not present

## 2019-02-20 DIAGNOSIS — E739 Lactose intolerance, unspecified: Secondary | ICD-10-CM | POA: Diagnosis not present

## 2019-02-20 DIAGNOSIS — Z951 Presence of aortocoronary bypass graft: Secondary | ICD-10-CM | POA: Insufficient documentation

## 2019-02-20 DIAGNOSIS — I4819 Other persistent atrial fibrillation: Secondary | ICD-10-CM | POA: Insufficient documentation

## 2019-02-20 DIAGNOSIS — I10 Essential (primary) hypertension: Secondary | ICD-10-CM | POA: Diagnosis not present

## 2019-02-20 DIAGNOSIS — Z79899 Other long term (current) drug therapy: Secondary | ICD-10-CM | POA: Diagnosis not present

## 2019-02-20 DIAGNOSIS — E785 Hyperlipidemia, unspecified: Secondary | ICD-10-CM | POA: Diagnosis not present

## 2019-02-20 NOTE — Progress Notes (Signed)
Primary Care Physician: Shon Baton, MD Primary Cardiologist: Dr Selena Batten  Primary Electrophysiologist: Dr Rayann Heman Referring Physician: [facility]   Nicole Hampton is a 73 y.o. female with a history of VT and VF arrest s/p ICD, paroxysmal afib, HLD, HTN, CAD s/p CABG, and MVR. Patient was noted to be in persistent AF since 01/03/19. She reports that she has been unaware of her arrhythmia with minimal fatigue. She had recently started a supplement called Preservision for eye health but that is the only change recently. She is no longer taking it. No other triggers. She denies snoring or alcohol use.  Patient is s/p DCCV on 02/11/19. She reports that the evening after her DCCV, she had some heart racing symptoms. Manual device transmission showed atrial flutter which lasted for 11 hrs. Patient reports that since that time, she has done well with only fleeting palpitations and increased exercise tolerance. Her husband notes that she has acted more like herself post cardioversion.   Today, she denies symptoms of chest pain, orthopnea, PND, lower extremity edema, dizziness, presyncope, syncope, snoring, daytime somnolence, bleeding, or neurologic sequela. The patient is tolerating medications without difficulties and is otherwise without complaint today.    Atrial Fibrillation Risk Factors:  she does not have symptoms or diagnosis of sleep apnea. she does not have a history of alcohol use. The patient does not have a history of early familial atrial fibrillation or other arrhythmias.  she has a BMI of Body mass index is 21.58 kg/m.[name] Filed Weights   02/20/19 1401  Weight: 53.5 kg    Family History  Problem Relation Age of Onset  . Arthritis Mother 31       DROWNING WITH HURRICANE [name]  . Hypertension Father 78       DROWNING WITH HURRICANE [name]  . ALS Sister   . Pancreatic cancer Brother   . Macular degeneration Brother      Atrial Fibrillation Management history:   Previous antiarrhythmic drugs: none Previous cardioversions: 02/11/19 Previous ablations: none CHADS2VASC score: 4 Anticoagulation history: Eliquis   Past Medical History:  Diagnosis Date  . A-fib ([place])   . BCC (basal cell carcinoma)    SEES DERM.  [name] Complication of anesthesia    extremem nausea and vomiting  . Hyperlipidemia    WITH ELEVATED LDL/HDL CLEAN CORS 2006. DR. Norville Haggard  . Hypertension   . Hypokalemia    CHRONIC  . Hypothyroidism   . Lactose intolerance   . OA (osteoarthritis)    HANDS AND FEET  . Pancreatitis 1982   GB  . S/P cholecystectomy    OPEN  . Sudden cardiac death ([place]) [place]. AICD  . Vertigo    RECURRENT   Past Surgical History:  Procedure Laterality Date  . CARDIOVERSION N/A 02/11/2019   Procedure: CARDIOVERSION;  Surgeon: Elouise Munroe, MD;  Location[facility] END[facility]COPY;  Service: Cardiovascular;  Laterality: N/A;  . CESAREAN SECTION     x1  . ICD GENERATOR CHANGE  05/25/2014   MDT Gwyneth Revels S DR ICD implanted by Dr Rosita Fire (secondary prevention)  . 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 OPEN HEART SURGERY WITH DEFIB  2006   1996    Current Outpatient Medications  Medication Sig Dispense Refill  . aMILoride (MIDAMOR) 5 MG tablet Take 5 mg by mouth daily.    [name] apixaban (ELIQUIS) 5 MG TABS tablet  Take 1 tablet (5 mg total) by mouth 2 (two) times daily. 60 tablet 6  . Ascorbic Acid (VITAMIN C PO) Take 1 tablet by mouth daily.    [name] azelastine (ASTELIN) 0.1 % nasal spray Place 1 spray into both nostrils 2 (two) times daily as needed for rhinitis. Use in each nostril as directed    . Calcium Carbonate-Vitamin D (CALTRATE 600+D PO) Take 3 tablets by mouth daily.     [name] estradiol (ESTRACE) 2 MG tablet Take 1 mg by mouth at bedtime.    [name] ezetimibe (ZETIA) 10 MG tablet Take 10 mg by mouth daily.    . fexofenadine (ALLEGRA) 180 MG tablet Take 180  mg by mouth daily as needed for allergies or rhinitis.    . fluticasone (FLONASE) 50 MCG/ACT nasal spray Place 2 sprays into both nostrils daily as needed for allergies.     [name] levothyroxine (SYNTHROID) 25 MCG tablet Take 25 mcg by mouth daily before breakfast.    . Magnesium Cl-Calcium Carbonate (SLOW-MAG PO) Take 1 tablet by mouth daily.    . meclizine (ANTIVERT) 12.5 MG tablet Take 12.5 mg by mouth 3 (three) times daily as needed for dizziness.    . Multiple Vitamin (MULTIVITAMIN) tablet Take 1 tablet by mouth once a week.     . omega-3 acid ethyl esters (LOVAZA) 1 g capsule Take 1 g by mouth every other day.     . pindolol (VISKEN) 5 MG tablet TAKE ONE TABLET DAILY 90 tablet 1  . potassium chloride (MICRO-K) 10 MEQ CR capsule Take 30-40 mEq by mouth See admin instructions. Take 40 meq in the morning and 30 meq at night    . temazepam (RESTORIL) 7.5 MG capsule Take 7.5 mg by mouth at bedtime as needed for sleep.     [name] amoxicillin (AMOXIL) 500 MG capsule Take 2,000 mg by mouth See admin instructions. Take 2000 mg 1 hour prior to dental work     No current facility-administered medications for this encounter.     Allergies  Allergen Reactions  . Cefuroxime Axetil Palpitations  . Codeine Nausea And Vomiting and Other (See Comments)    Gi Upset   . Morphine Palpitations and Rash  . Quinidine Gluconate Other (See Comments)    Positive ANA, tachycardia  . Other Nausea And Vomiting    general anesthesia - severe nausea and vomiting   . Latex Rash    Social History   Socioeconomic History  . Marital status: Married    Spouse name: [place]  . Number of children: 2  . Years of education: Not on file  . Highest education level: Not on file  Occupational History  . Occupation: [employer]  Social Needs  . Financial resource strain: Not on file  . Food insecurity    Worry: Not on file    Inability: Not on file  . Transportation needs    Medical: Not on file    Non-medical:  Not on file  Tobacco Use  . Smoking status: Former Smoker    Quit date: 06/23/1975    Years since quitting: 43.6  . Smokeless tobacco: Never Used  Substance and Sexual Activity  . Alcohol use: Yes  . Drug use: No  . Sexual activity: Not on file  Lifestyle  . Physical activity    Days per week: Not on file    Minutes per session: Not on file  . Stress: Not on file  Relationships  . Social connections  Talks on phone: Not on file    Gets together: Not on file    Attends religious service: Not on file    Active member of club or organization: Not on file    Attends meetings of clubs or organizations: Not on file    Relationship status: Not on file  . Intimate partner violence    Fear of current or ex partner: Not on file    Emotionally abused: Not on file    Physically abused: Not on file    Forced sexual activity: Not on file  Other Topics Concern  . Not on file  Social History Narrative  . Not on file     ROS- All systems are reviewed and negative except as per the HPI above.  Physical Exam: Vitals:   02/20/19 1401  BP: 116/62  Pulse: 73  Weight: 53.5 kg  Height: 5\' 2"  (1.575 m)    GEN- The patient is well appearing, alert and oriented x 3 today.   HEENT-head normocephalic, atraumatic, sclera clear, conjunctiva pink, hearing intact, trachea midline. Lungs- Clear to ausculation bilaterally, normal work of breathing Heart- Regular rate and rhythm, no murmurs, rubs or gallops  GI- soft, NT, ND, + BS Extremities- no clubbing, cyanosis, or edema MS- no significant deformity or atrophy Skin- no rash or lesion Psych- euthymic mood, full affect Neuro- strength and sensation are intact   Wt Readings from Last 3 Encounters:  02/20/19 53.5 kg  02/11/19 53.5 kg  01/29/19 53.5 kg    EKG today demonstrates A paced rhythm, HR 73, QRS 92, QTc 460  Echo 12/09/18 demonstrated  1. The left ventricle has normal systolic function with an ejection fraction of 60-65%. The  cavity size was normal. Left ventricular diastolic Doppler parameters are indeterminate.  2. The right ventricle has normal systolic function. The cavity was normal.  3. Left atrial size was mildly dilated.  4. Mild thickening of the mitral valve leaflet. Mild mitral valve stenosis.  5. The tricuspid valve is grossly normal.  6. The aortic valve is tricuspid. Mild thickening of the aortic valve.  7. Akinesis of the basal inferior wall with overall normal LV function; s/p MV repair with mean gradient 11 mmHg; MVA 1.8 cm2 by pressure halftime; likely mild MS; mild LAE; mild TR with mild pulmonary hypertension.  Epic records are reviewed at length today  Assessment and Plan:  1. Persistent atrial fibrillation/atrial flutter S/p DCCV 02/11/19. Patient in A paced rhythm today. She has had fleeting palpitations but overall feels better since DCCV. Will have her send manual transmission to assess AF burden. We also discussed wearable device technology for monitoring afib. She would like to avoid AAD therapy if at all possible.  Continue Eliquis 5 mg BID Continue pindolol 5 mg daily.  This patients CHA2DS2-VASc Score and unadjusted Ischemic Stroke Rate (% per year) is equal to 4.8 % stroke rate/year from a score of 4  Above score calculated as 1 point each if present [CHF, HTN, DM, Vascular=MI/PAD/Aortic Plaque, Age if 65-74, or Female] Above score calculated as 2 points each if present [Age > 75, or Stroke/TIA/TE]   2. HTN Stable, no changes today.  3. CAD S/p CABG 2006. No anginal symptoms. Continue present therapy and risk factor modification.   4. VF arrest/NSVT S/p ICD, followed by Dr Rayann Heman and device clinic.   Follow up in AF clinic in 3 months.   [facility] [place], [place]  [address] [phone] 02/20/2019 2:54 PM

## 2019-02-24 ENCOUNTER — Telehealth (HOSPITAL_COMMUNITY): Payer: Self-pay | Admitting: Physician Assistant

## 2019-02-24 NOTE — Telephone Encounter (Signed)
Called and spoke to patient about turning on atrial therapies for her atrial fibrillation. Patient reports that she is feeling well currently and would like to hold off for the time being. She will call back with an update in one week.

## 2019-03-04 ENCOUNTER — Telehealth (HOSPITAL_COMMUNITY): Payer: Self-pay | Admitting: *Deleted

## 2019-03-04 NOTE — Telephone Encounter (Signed)
Patient remains in normal rhythm per remote transmission today.    Chanetta Marshall, NP 03/04/2019 2:43 PM

## 2019-03-04 NOTE — Telephone Encounter (Addendum)
Pt was advised that she is in normal rhythm per her transmission today.  Pt is now wondering why she is SOB with exertion.  Pt has checked HR and it is between 79-85 and O2 is between 92-96 %.  Pt states that she feels like she has congestion in her chest, but has up until now thought this to be afib.  Pt was advised to contact her PCP and report symptoms, O2 and see what their advisement would be

## 2019-03-04 NOTE — Telephone Encounter (Signed)
Pt cld stating that she sent a remote transmission in today to have it reviewed to see if she is still out of rhythm.  Pt reports that her dccv did not last and that her and Clint Fenton, PA were to discuss some different options for her afib.[name]  She wants to know if this transmission can be reviewed and if someone could call her with the results to discuss.  FYI to Malka So, PA for FYI, forwarded to device clinic for device report.  Thank you

## 2019-04-07 ENCOUNTER — Ambulatory Visit (INDEPENDENT_AMBULATORY_CARE_PROVIDER_SITE_OTHER): Payer: Medicare Other | Admitting: *Deleted

## 2019-04-07 DIAGNOSIS — I4891 Unspecified atrial fibrillation: Secondary | ICD-10-CM

## 2019-04-07 LAB — CUP PACEART REMOTE DEVICE CHECK
Battery Remaining Longevity: 21 mo
Battery Voltage: 2.94 V
Brady Statistic AP VP Percent: 0.19 %
Brady Statistic AP VS Percent: 98.25 %
Brady Statistic AS VP Percent: 0.01 %
Brady Statistic AS VS Percent: 1.55 %
Brady Statistic RA Percent Paced: 97.51 %
Brady Statistic RV Percent Paced: 0.24 %
Date Time Interrogation Session: 20200914062603
HighPow Impedance: 39 Ohm
HighPow Impedance: 52 Ohm
Implantable Lead Implant Date: 20030319
Implantable Lead Implant Date: 20141103
Implantable Lead Location: 753859
Implantable Lead Location: 753860
Implantable Lead Model: 5076
Implantable Lead Model: 6947
Implantable Pulse Generator Implant Date: 20171103
Lead Channel Impedance Value: 418 Ohm
Lead Channel Impedance Value: 646 Ohm
Lead Channel Impedance Value: 703 Ohm
Lead Channel Pacing Threshold Amplitude: 0.75 V
Lead Channel Pacing Threshold Amplitude: 2.5 V
Lead Channel Pacing Threshold Pulse Width: 0.4 ms
Lead Channel Pacing Threshold Pulse Width: 0.4 ms
Lead Channel Sensing Intrinsic Amplitude: 0.5 mV
Lead Channel Sensing Intrinsic Amplitude: 0.5 mV
Lead Channel Sensing Intrinsic Amplitude: 3.375 mV
Lead Channel Sensing Intrinsic Amplitude: 3.375 mV
Lead Channel Setting Pacing Amplitude: 2 V
Lead Channel Setting Pacing Amplitude: 4.5 V
Lead Channel Setting Pacing Pulse Width: 1 ms
Lead Channel Setting Sensing Sensitivity: 0.45 mV

## 2019-04-18 NOTE — Progress Notes (Signed)
Remote ICD transmission.   

## 2019-05-14 ENCOUNTER — Encounter (HOSPITAL_COMMUNITY): Payer: Self-pay | Admitting: Internal Medicine

## 2019-05-22 ENCOUNTER — Ambulatory Visit (HOSPITAL_COMMUNITY)
Admission: RE | Admit: 2019-05-22 | Discharge: 2019-05-22 | Disposition: A | Discharge status: Home / Self Care | Payer: Medicare Other | Source: Ambulatory Visit | Attending: Physician Assistant | Admitting: Physician Assistant

## 2019-05-22 ENCOUNTER — Other Ambulatory Visit: Payer: Self-pay

## 2019-05-22 ENCOUNTER — Encounter (HOSPITAL_COMMUNITY): Payer: Self-pay | Admitting: Nurse Practitioner

## 2019-05-22 VITALS — BP 120/80 | HR 73 | Ht 62.0 in | Wt 120.0 lb

## 2019-05-22 DIAGNOSIS — Z87891 Personal history of nicotine dependence: Secondary | ICD-10-CM | POA: Diagnosis not present

## 2019-05-22 DIAGNOSIS — Z951 Presence of aortocoronary bypass graft: Secondary | ICD-10-CM | POA: Diagnosis not present

## 2019-05-22 DIAGNOSIS — E039 Hypothyroidism, unspecified: Secondary | ICD-10-CM | POA: Diagnosis not present

## 2019-05-22 DIAGNOSIS — Z7901 Long term (current) use of anticoagulants: Secondary | ICD-10-CM | POA: Insufficient documentation

## 2019-05-22 DIAGNOSIS — Z79899 Other long term (current) drug therapy: Secondary | ICD-10-CM | POA: Insufficient documentation

## 2019-05-22 DIAGNOSIS — I4819 Other persistent atrial fibrillation: Secondary | ICD-10-CM | POA: Diagnosis present

## 2019-05-22 DIAGNOSIS — I10 Essential (primary) hypertension: Secondary | ICD-10-CM | POA: Insufficient documentation

## 2019-05-22 DIAGNOSIS — E785 Hyperlipidemia, unspecified: Secondary | ICD-10-CM | POA: Diagnosis not present

## 2019-05-22 DIAGNOSIS — M159 Polyosteoarthritis, unspecified: Secondary | ICD-10-CM | POA: Insufficient documentation

## 2019-05-22 DIAGNOSIS — I2510 Atherosclerotic heart disease of native coronary artery without angina pectoris: Secondary | ICD-10-CM | POA: Insufficient documentation

## 2019-05-22 DIAGNOSIS — I480 Paroxysmal atrial fibrillation: Secondary | ICD-10-CM

## 2019-05-22 DIAGNOSIS — I4892 Unspecified atrial flutter: Secondary | ICD-10-CM | POA: Insufficient documentation

## 2019-05-22 NOTE — Progress Notes (Signed)
Primary Care Physician: Shon Baton, MD Primary Cardiologist: Dr Selena Batten  Primary Electrophysiologist: Dr Rayann Heman Referring Physician: [facility]   Nicole Hampton is a 73 y.o. female with a history of VT and VF arrest s/p ICD, paroxysmal afib, HLD, HTN, CAD s/p CABG, and MVR. Patient was noted to be in persistent AF since 01/03/19. She reports that she has been unaware of her arrhythmia with minimal fatigue. She had recently started a supplement called Preservision for eye health but that is the only change recently. She is no longer taking it. No other triggers. She denies snoring or alcohol use.  Patient is s/p DCCV on 02/11/19. She reports that the evening after her DCCV, she had some heart racing symptoms. Manual device transmission showed atrial flutter which lasted for 11 hrs. Patient reports that since that time, she has done well with only fleeting palpitations and increased exercise tolerance. Her husband notes that she has acted more like herself post  cardioversion.   F/u in afib clinic, 10/29.[name] She has not had any afib of which she is aware. Transient dizziness with position changes, she contributes to vertigo. Has Antivert that she takes occasionally.   Today, she denies symptoms of chest pain, orthopnea, PND, lower extremity edema,  presyncope, syncope, snoring, daytime somnolence, bleeding, or neurologic sequela. The patient is tolerating medications without difficulties and is otherwise without complaint today.    Atrial Fibrillation Risk Factors:  she does not have symptoms or diagnosis of sleep apnea. she does not have a history of alcohol use. The patient does not have a history of early familial atrial fibrillation or other arrhythmias.  she has a BMI of Body mass index is 21.95 kg/m.[name] Filed Weights   05/22/19 1430  Weight: 54.4 kg    Family History  Problem Relation Age of Onset   Arthritis Mother 74       DROWNING WITH HURRICANE [name]    Hypertension Father 56       DROWNING WITH HURRICANE [name]   ALS Sister    Pancreatic cancer Brother    Macular degeneration Brother      Atrial Fibrillation Management history:  Previous antiarrhythmic drugs: none Previous cardioversions: 02/11/19 Previous ablations: none CHADS2VASC score: 4 Anticoagulation history: Eliquis   Past Medical History:  Diagnosis Date   A-fib ([place])    BCC (basal cell carcinoma)    SEES DERM.   Complication of anesthesia    extremem nausea and vomiting   Hyperlipidemia    WITH ELEVATED LDL/HDL CLEAN CORS 2006. DR. Nicole Kindred SIMMONS   Hypertension    Hypokalemia    CHRONIC   Hypothyroidism    Lactose intolerance    OA (osteoarthritis)    HANDS AND FEET   Pancreatitis 1982   GB   S/P cholecystectomy    OPEN   Sudden cardiac death ([place]) 1986   RELATED TO SEVERE HYPOKALEMIA. AICD   Vertigo    RECURRENT   Past Surgical History:  Procedure Laterality Date   CARDIOVERSION N/A 02/11/2019   Procedure: CARDIOVERSION;  Surgeon: Elouise Munroe, MD;  Location: [facility] ENDOSCOPY;  Service: Cardiovascular;  Laterality: N/A;   CESAREAN SECTION     x1   ICD GENERATOR CHANGE  05/25/2014   MDT Gwyneth Revels S DR ICD implanted by Dr Rosita Fire (secondary prevention)   MITRAL VALVE REPAIR (MV)/CORONARY ARTERY BYPASS GRAFTING (CABG)  2006   NV ISSUES PROBABLY FROM RHEUMATIC FEVER   OPEN HEART SURGERY WITH DEFIB  2006   1996  Current Outpatient Medications  Medication Sig Dispense Refill   aMILoride (MIDAMOR) 5 MG tablet Take 5 mg by mouth daily.     amoxicillin (AMOXIL) 500 MG capsule Take 2,000 mg by mouth See admin instructions. Take 2000 mg 1 hour prior to dental work     apixaban (ELIQUIS) 5 MG TABS tablet Take 1 tablet (5 mg total) by mouth 2 (two) times daily. 60 tablet 6   Ascorbic Acid (VITAMIN C PO) Take 1 tablet by mouth daily.     azelastine (ASTELIN) 0.1 % nasal spray Place 1 spray into both nostrils 2 (two) times daily as  needed for rhinitis. Use in each nostril as directed     Calcium Carbonate-Vitamin D (CALTRATE 600+D PO) Take 3 tablets by mouth daily.      estradiol (ESTRACE) 2 MG tablet Take 1 mg by mouth at bedtime.     ezetimibe (ZETIA) 10 MG tablet Take 10 mg by mouth daily.     fexofenadine (ALLEGRA) 180 MG tablet Take 180 mg by mouth daily as needed for allergies or rhinitis.     fluticasone (FLONASE) 50 MCG/ACT nasal spray Place 2 sprays into both nostrils daily as needed for allergies.      levothyroxine (SYNTHROID) 25 MCG tablet Take 25 mcg by mouth daily before breakfast.     Magnesium Cl-Calcium Carbonate (SLOW-MAG PO) Take 1 tablet by mouth daily.     meclizine (ANTIVERT) 12.5 MG tablet Take 12.5 mg by mouth 3 (three) times daily as needed for dizziness.     Multiple Vitamin (MULTIVITAMIN) tablet Take 1 tablet by mouth once a week.      omega-3 acid ethyl esters (LOVAZA) 1 g capsule Take 1 g by mouth every other day.      pindolol (VISKEN) 5 MG tablet TAKE ONE TABLET DAILY 90 tablet 1   potassium chloride (MICRO-K) 10 MEQ CR capsule Take 30-40 mEq by mouth See admin instructions. Take 40 meq in the morning and 30 meq at night     temazepam (RESTORIL) 7.5 MG capsule Take 7.5 mg by mouth at bedtime as needed for sleep.      No current facility-administered medications for this encounter.     Allergies  Allergen Reactions   Cefuroxime Axetil Palpitations   Codeine Nausea And Vomiting and Other (See Comments)    Gi Upset    Morphine Palpitations and Rash   Quinidine Gluconate Other (See Comments)    Positive ANA, tachycardia   Other Nausea And Vomiting    general anesthesia - severe nausea and vomiting    Latex Rash    Social History   Socioeconomic History   Marital status: Married    Spouse name: JOHN   Number of children: 2   Years of education: Not on file   Highest education level: Not on file  Occupational History   Occupation: [employer]   Financial resource strain: Not on file   Food insecurity    Worry: Not on file    Inability: Not on file   Transportation needs    Medical: Not on file    Non-medical: Not on file  Tobacco Use   Smoking status: Former Smoker    Quit date: 06/23/1975    Years since quitting: 43.9   Smokeless tobacco: Never Used  Substance and Sexual Activity   Alcohol use: Not Currently   Drug use: No   Sexual activity: Not on file  Lifestyle   Physical activity  Days per week: Not on file    Minutes per session: Not on file   Stress: Not on file  Relationships   Social connections    Talks on phone: Not on file    Gets together: Not on file    Attends religious service: Not on file    Active member of club or organization: Not on file    Attends meetings of clubs or organizations: Not on file    Relationship status: Not on file   Intimate partner violence    Fear of current or ex partner: Not on file    Emotionally abused: Not on file    Physically abused: Not on file    Forced sexual activity: Not on file  Other Topics Concern   Not on file  Social History Narrative   Not on file     ROS- All systems are reviewed and negative except as per the HPI above.  Physical Exam: Vitals:   05/22/19 1430  BP: 120/80  Pulse: 73  Weight: 54.4 kg  Height: 5\' 2"  (1.575 m)    GEN- The patient is well appearing, alert and oriented x 3 today.   HEENT-head normocephalic, atraumatic, sclera clear, conjunctiva pink, hearing intact, trachea midline. Lungs- Clear to ausculation bilaterally, normal work of breathing Heart- Regular rate and rhythm, no murmurs, rubs or gallops  GI- soft, NT, ND, + BS Extremities- no clubbing, cyanosis, or edema MS- no significant deformity or atrophy Skin- no rash or lesion Psych- euthymic mood, full affect Neuro- strength and sensation are intact   Wt Readings from Last 3 Encounters:  05/22/19 54.4 kg  02/20/19 53.5 kg   02/11/19 53.5 kg    EKG today demonstrates A paced rhythm, HR 73, QRS 88, QTc 429  Echo 12/09/18 demonstrated  1. The left ventricle has normal systolic function with an ejection fraction of 60-65%. The cavity size was normal. Left ventricular diastolic Doppler parameters are indeterminate.  2. The right ventricle has normal systolic function. The cavity was normal.  3. Left atrial size was mildly dilated.  4. Mild thickening of the mitral valve leaflet. Mild mitral valve stenosis.  5. The tricuspid valve is grossly normal.  6. The aortic valve is tricuspid. Mild thickening of the aortic valve.  7. Akinesis of the basal inferior wall with overall normal LV function; s/p MV repair with mean gradient 11 mmHg; MVA 1.8 cm2 by pressure halftime; likely mild MS; mild LAE; mild TR with mild pulmonary hypertension.  Epic records are reviewed at length today  Assessment and Plan:  1. Persistent atrial fibrillation/atrial flutter S/p DCCV 02/11/19. Patient in A paced rhythm today. She would like to avoid AAD therapy if at all possible.  Continue Eliquis 5 mg BID Continue pindolol 5 mg daily.  This patients CHA2DS2-VASc Score and unadjusted Ischemic Stroke Rate (% per year) is equal to 4.8 % stroke rate/year from a score of 4  Above score calculated as 1 point each if present [CHF, HTN, DM, Vascular=MI/PAD/Aortic Plaque, Age if 65-74, or Female] Above score calculated as 2 points each if present [Age > 75, or Stroke/TIA/TE]   2. HTN Stable, no changes today.  3. CAD S/p CABG 2006. No anginal symptoms. Continue present therapy and risk factor modification.   4. VF arrest/NSVT S/p ICD, followed by Dr Rayann Heman and device clinic.   Follow up in AF clinic in 6 months with Adline Peals, PA  Geroge Baseman. Guiseppe Flanagan, [facility] [address]  [address] [place], [place] [address] [phone]

## 2019-06-04 ENCOUNTER — Other Ambulatory Visit: Payer: Self-pay | Admitting: Internal Medicine

## 2019-07-07 ENCOUNTER — Ambulatory Visit (INDEPENDENT_AMBULATORY_CARE_PROVIDER_SITE_OTHER): Payer: Medicare Other | Admitting: *Deleted

## 2019-07-07 DIAGNOSIS — Z95810 Presence of automatic (implantable) cardiac defibrillator: Secondary | ICD-10-CM

## 2019-07-07 LAB — CUP PACEART REMOTE DEVICE CHECK
Battery Remaining Longevity: 27 mo
Battery Voltage: 2.94 V
Brady Statistic AP VP Percent: 0.16 %
Brady Statistic AP VS Percent: 98.9 %
Brady Statistic AS VP Percent: 0.01 %
Brady Statistic AS VS Percent: 0.93 %
Brady Statistic RA Percent Paced: 98.23 %
Brady Statistic RV Percent Paced: 0.19 %
Date Time Interrogation Session: 20201214043725
HighPow Impedance: 38 Ohm
HighPow Impedance: 49 Ohm
Implantable Lead Implant Date: 20030319
Implantable Lead Implant Date: 20141103
Implantable Lead Location: 753859
Implantable Lead Location: 753860
Implantable Lead Model: 5076
Implantable Lead Model: 6947
Implantable Pulse Generator Implant Date: 20171103
Lead Channel Impedance Value: 399 Ohm
Lead Channel Impedance Value: 608 Ohm
Lead Channel Impedance Value: 665 Ohm
Lead Channel Pacing Threshold Amplitude: 0.75 V
Lead Channel Pacing Threshold Amplitude: 2.5 V
Lead Channel Pacing Threshold Pulse Width: 0.4 ms
Lead Channel Pacing Threshold Pulse Width: 0.4 ms
Lead Channel Sensing Intrinsic Amplitude: 0.375 mV
Lead Channel Sensing Intrinsic Amplitude: 0.375 mV
Lead Channel Sensing Intrinsic Amplitude: 3 mV
Lead Channel Sensing Intrinsic Amplitude: 3 mV
Lead Channel Setting Pacing Amplitude: 2 V
Lead Channel Setting Pacing Amplitude: 4.5 V
Lead Channel Setting Pacing Pulse Width: 1 ms
Lead Channel Setting Sensing Sensitivity: 0.45 mV

## 2019-08-01 ENCOUNTER — Other Ambulatory Visit (HOSPITAL_COMMUNITY): Payer: Self-pay | Admitting: Physician Assistant

## 2019-08-09 NOTE — Progress Notes (Signed)
ICD remote 

## 2019-08-11 ENCOUNTER — Telehealth (HOSPITAL_COMMUNITY): Payer: Self-pay | Admitting: *Deleted

## 2019-08-11 NOTE — Telephone Encounter (Signed)
Pt notified. Will come for appt tomorrow with Adline Peals PA

## 2019-08-11 NOTE — Telephone Encounter (Signed)
Patient transmission received and she is currently in AFL.

## 2019-08-11 NOTE — Telephone Encounter (Signed)
Patient called in stating she thinks she maybe back in AF. Maybe the last several days. Noticing fatigue and some heart racing. She would like to send a transmission to confirm her rhythm. Will call her back once transmission received.

## 2019-08-12 ENCOUNTER — Other Ambulatory Visit: Payer: Self-pay

## 2019-08-12 ENCOUNTER — Ambulatory Visit (HOSPITAL_COMMUNITY)
Admission: RE | Admit: 2019-08-12 | Discharge: 2019-08-12 | Disposition: A | Discharge status: Home / Self Care | Payer: Medicare Other | Source: Ambulatory Visit | Attending: Physician Assistant | Admitting: Physician Assistant

## 2019-08-12 VITALS — BP 126/96 | HR 108 | Ht 62.0 in | Wt 122.0 lb

## 2019-08-12 DIAGNOSIS — I4819 Other persistent atrial fibrillation: Secondary | ICD-10-CM | POA: Insufficient documentation

## 2019-08-12 DIAGNOSIS — I4892 Unspecified atrial flutter: Secondary | ICD-10-CM | POA: Insufficient documentation

## 2019-08-12 DIAGNOSIS — E785 Hyperlipidemia, unspecified: Secondary | ICD-10-CM | POA: Insufficient documentation

## 2019-08-12 DIAGNOSIS — I2510 Atherosclerotic heart disease of native coronary artery without angina pectoris: Secondary | ICD-10-CM | POA: Diagnosis not present

## 2019-08-12 DIAGNOSIS — Z79899 Other long term (current) drug therapy: Secondary | ICD-10-CM | POA: Insufficient documentation

## 2019-08-12 DIAGNOSIS — I472 Ventricular tachycardia: Secondary | ICD-10-CM | POA: Diagnosis not present

## 2019-08-12 DIAGNOSIS — E039 Hypothyroidism, unspecified: Secondary | ICD-10-CM | POA: Insufficient documentation

## 2019-08-12 DIAGNOSIS — D6869 Other thrombophilia: Secondary | ICD-10-CM

## 2019-08-12 DIAGNOSIS — Z7901 Long term (current) use of anticoagulants: Secondary | ICD-10-CM | POA: Diagnosis not present

## 2019-08-12 DIAGNOSIS — I484 Atypical atrial flutter: Secondary | ICD-10-CM | POA: Insufficient documentation

## 2019-08-12 DIAGNOSIS — E876 Hypokalemia: Secondary | ICD-10-CM | POA: Diagnosis not present

## 2019-08-12 DIAGNOSIS — M158 Other polyosteoarthritis: Secondary | ICD-10-CM | POA: Diagnosis not present

## 2019-08-12 DIAGNOSIS — I10 Essential (primary) hypertension: Secondary | ICD-10-CM | POA: Diagnosis not present

## 2019-08-12 DIAGNOSIS — Z87891 Personal history of nicotine dependence: Secondary | ICD-10-CM | POA: Diagnosis not present

## 2019-08-12 DIAGNOSIS — Z951 Presence of aortocoronary bypass graft: Secondary | ICD-10-CM | POA: Diagnosis not present

## 2019-08-12 DIAGNOSIS — Z8674 Personal history of sudden cardiac arrest: Secondary | ICD-10-CM | POA: Insufficient documentation

## 2019-08-12 MED ORDER — PINDOLOL 5 MG PO TABS: 5.0000 mg | ORAL_TABLET | Freq: Every day | ORAL | 3 refills | Status: DC

## 2019-08-12 NOTE — Progress Notes (Signed)
Primary Care Physician: Shon Baton, MD Primary Cardiologist: Dr Selena Batten  Primary Electrophysiologist: Dr Rayann Heman Referring Physician: [facility]   Nicole Hampton is a 74 y.o. female with a history of VT and VF arrest s/p ICD, paroxysmal afib, HLD, HTN, CAD s/p CABG, and MVR. Patient was noted to be in persistent AF since 01/03/19. She reports that she has been unaware of her arrhythmia with minimal fatigue. She had recently started a supplement called Preservision for eye health but that is the only change recently. She is no longer taking it. No other triggers. She denies snoring or alcohol use. Patient is s/p DCCV on 02/11/19. She is on Eliquis for a CHADS2VASC score of 4.   On follow up today, patient reports that she had several days of heart racing and fatigue. Manual device transmission confirmed she was in atrial flutter. She does reports that she has not slept well for several nights because she is caring for her husband who recently had surgery. Overall, patient states that she feels well and is still able to do everything she wants, even in afib.  Today, she denies symptoms of chest pain, orthopnea, PND, lower extremity edema,  presyncope, syncope, snoring, daytime somnolence, bleeding, or neurologic sequela. The patient is tolerating medications without difficulties and is otherwise without complaint today.    Atrial Fibrillation Risk Factors:  she does not have symptoms or diagnosis of sleep apnea. she does not have a history of alcohol use. The patient does not have a history of early familial atrial fibrillation or other arrhythmias.  she has a BMI of Body mass index is 22.31 kg/m.[name] Filed Weights   08/12/19 1334  Weight: 55.3 kg    Family History  Problem Relation Age of Onset  . Arthritis Mother 83       DROWNING WITH HURRICANE [name]  . Hypertension Father 9       DROWNING WITH HURRICANE [name]  . ALS Sister   . Pancreatic cancer Brother   . Macular  degeneration Brother      Atrial Fibrillation Management history:  Previous antiarrhythmic drugs: none Previous cardioversions: 02/11/19 Previous ablations: none CHADS2VASC score: 4 Anticoagulation history: Eliquis   Past Medical History:  Diagnosis Date  . A-fib ([place])   . BCC (basal cell carcinoma)    SEES DERM.  [name] Complication of anesthesia    extremem nausea and vomiting  . Hyperlipidemia    WITH ELEVATED LDL/HDL CLEAN CORS 2006. DR. Norville Haggard  . Hypertension   . Hypokalemia    CHRONIC  . Hypothyroidism   . Lactose intolerance   . OA (osteoarthritis)    HANDS AND FEET  . Pancreatitis 1982   GB  . S/P cholecystectomy    OPEN  . Sudden cardiac death ([place]) [place]. AICD  . Vertigo    RECURRENT   Past Surgical History:  Procedure Laterality Date  . CARDIOVERSION N/A 02/11/2019   Procedure: CARDIOVERSION;  Surgeon: Elouise Munroe, MD;  Location: [facility] ENDOSCOPY;  Service: Cardiovascular;  Laterality: N/A;  . CESAREAN SECTION     x1  . ICD GENERATOR CHANGE  05/25/2014   MDT Gwyneth Revels S DR ICD implanted by Dr Rosita Fire (secondary prevention)  . 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 OPEN HEART SURGERY WITH DEFIB  2006   1996    Current Outpatient Medications  Medication Sig Dispense Refill  .  aMILoride (MIDAMOR) 5 MG tablet Take 5 mg by mouth daily.    [name] amoxicillin (AMOXIL) 500 MG capsule Take 2,000 mg by mouth See admin instructions. Take 2000 mg 1 hour prior to dental work    . Ascorbic Acid (VITAMIN C PO) Take 1 tablet by mouth daily.    [name] azelastine (ASTELIN) 0.1 % nasal spray Place 1 spray into both nostrils 2 (two) times daily as needed for rhinitis. Use in each nostril as directed    . Calcium Carbonate-Vitamin D (CALTRATE 600+D PO) Take 3 tablets by mouth daily.     [name] ELIQUIS 5 MG TABS tablet TAKE ONE TABLET TWICE DAILY 60 tablet 6  . estradiol  (ESTRACE) 2 MG tablet Take 1 mg by mouth at bedtime.    [name] ezetimibe (ZETIA) 10 MG tablet Take 10 mg by mouth daily.    . fexofenadine (ALLEGRA) 180 MG tablet Take 180 mg by mouth daily as needed for allergies or rhinitis.    . fluticasone (FLONASE) 50 MCG/ACT nasal spray Place 2 sprays into both nostrils daily as needed for allergies.     [name] levothyroxine (SYNTHROID) 25 MCG tablet Take 25 mcg by mouth daily before breakfast.    . Magnesium Cl-Calcium Carbonate (SLOW-MAG PO) Take 1 tablet by mouth daily.    . meclizine (ANTIVERT) 12.5 MG tablet Take 12.5 mg by mouth 3 (three) times daily as needed for dizziness.    . Multiple Vitamin (MULTIVITAMIN) tablet Take 1 tablet by mouth once a week.     . omega-3 acid ethyl esters (LOVAZA) 1 g capsule Take 1 g by mouth daily.     . pindolol (VISKEN) 5 MG tablet Take 1 tablet (5 mg total) by mouth daily. 90 tablet 3  . potassium chloride (MICRO-K) 10 MEQ CR capsule Take 30-40 mEq by mouth See admin instructions. Take 40 meq in the morning and 30 meq at night    . temazepam (RESTORIL) 7.5 MG capsule Take 7.5 mg by mouth at bedtime as needed for sleep.      No current facility-administered medications for this encounter.    Allergies  Allergen Reactions  . Cefuroxime Axetil Palpitations  . Codeine Nausea And Vomiting and Other (See Comments)    Gi Upset   . Morphine Palpitations and Rash  . Quinidine Gluconate Other (See Comments)    Positive ANA, tachycardia  . Other Nausea And Vomiting    general anesthesia - severe nausea and vomiting   . Latex Rash    Social History   Socioeconomic History  . Marital status: Married    Spouse name: [place]  . Number of children: 2  . Years of education: Not on file  . Highest education level: Not on file  Occupational History  . Occupation: [employer]  Tobacco Use  . Smoking status: Former Smoker    Quit date: 06/23/1975    Years since quitting: 44.1  . Smokeless tobacco: Never Used    Substance and Sexual Activity  . Alcohol use: Not Currently  . Drug use: No  . Sexual activity: Not on file  Other Topics Concern  . Not on file  Social History Narrative  . Not on file   Social Determinants of Health   Financial Resource Strain:   . Difficulty of Paying Living Expenses: Not on file  Food Insecurity:   . Worried About Charity fundraiser in the Last Year: Not on file  . Ran Out of Food in the Last Year:  Not on file  Transportation Needs:   . Lack of Transportation (Medical): Not on file  . Lack of Transportation (Non-Medical): Not on file  Physical Activity:   . Days of Exercise per Week: Not on file  . Minutes of Exercise per Session: Not on file  Stress:   . Feeling of Stress : Not on file  Social Connections:   . Frequency of Communication with Friends and Family: Not on file  . Frequency of Social Gatherings with Friends and Family: Not on file  . Attends Religious Services: Not on file  . Active Member of Clubs or Organizations: Not on file  . Attends Archivist Meetings: Not on file  . Marital Status: Not on file  Intimate Partner Violence:   . Fear of Current or Ex-Partner: Not on file  . Emotionally Abused: Not on file  . Physically Abused: Not on file  . Sexually Abused: Not on file     ROS- All systems are reviewed and negative except as per the HPI above.  Physical Exam: Vitals:   08/12/19 1334  BP: (!) 126/96  Pulse: (!) 108  Weight: 55.3 kg  Height: 5\' 2"  ([id number] m)    GEN- The patient is well appearing, alert and oriented x 3 today.   HEENT-head normocephalic, atraumatic, sclera clear, conjunctiva pink, hearing intact, trachea midline. Lungs- Clear to ausculation bilaterally, normal work of breathing Heart- irregular rate and rhythm, no murmurs, rubs or gallops  GI- soft, NT, ND, + BS Extremities- no clubbing, cyanosis, or edema MS- no significant deformity or atrophy Skin- no rash or lesion Psych- euthymic mood, full  affect Neuro- strength and sensation are intact   Wt Readings from Last 3 Encounters:  08/12/19 55.3 kg  05/22/19 54.4 kg  02/20/19 53.5 kg    EKG today demonstrates atypical atrial flutter with variable conduction HR 108, NST, QRS 84, QTc 493  Echo 12/09/18 demonstrated  1. The left ventricle has normal systolic function with an ejection fraction of 60-65%. The cavity size was normal. Left ventricular diastolic Doppler parameters are indeterminate.  2. The right ventricle has normal systolic function. The cavity was normal.  3. Left atrial size was mildly dilated.  4. Mild thickening of the mitral valve leaflet. Mild mitral valve stenosis.  5. The tricuspid valve is grossly normal.  6. The aortic valve is tricuspid. Mild thickening of the aortic valve.  7. Akinesis of the basal inferior wall with overall normal LV function; s/p MV repair with mean gradient 11 mmHg; MVA 1.8 cm2 by pressure halftime; likely mild MS; mild LAE; mild TR with mild pulmonary hypertension.  Epic records are reviewed at length today  Assessment and Plan:  1. Persistent atrial fibrillation/atrial flutter S/p DCCV 02/11/19.  Patient noted to be back in atrial flutter per device interrogation. We had a long discussion about therapeutic options including AAD +/- DCCV, ATP on her device, and rate control. Patient is very hesitant to start new medications. For now, will increase BB for rate control and see if she converts to SR on her own. If not, will plan for DCCV. Continue Eliquis 5 mg BID Increase pindolol to 5 mg daily (she was previously breaking the pill)  This patients CHA2DS2-VASc Score and unadjusted Ischemic Stroke Rate (% per year) is equal to 4.8 % stroke rate/year from a score of 4  Above score calculated as 1 point each if present [CHF, HTN, DM, Vascular=MI/PAD/Aortic Plaque, Age if 31-74, or Female] Above score calculated  as 2 points each if present [Age > 75, or Stroke/TIA/TE]   2.  HTN Stable, med changes as above.  3. CAD S/p CABG 2006.  No anginal symptoms.  4. VF arrest/NSVT S/p ICD, followed by Dr Rayann Heman and the device clinic.   Follow up in the AF clinic in 2 weeks.   [facility] [address] [place], [place] [address] [phone]

## 2019-08-22 ENCOUNTER — Telehealth: Payer: Self-pay | Admitting: Internal Medicine

## 2019-08-22 NOTE — Telephone Encounter (Signed)
Pt wants to keep afib appt and appt made with JA  Advised JA appt was virtual.  No further action necessary.

## 2019-08-22 NOTE — Telephone Encounter (Signed)
Patient c/o Palpitations:  High priority if patient c/o lightheadedness, shortness of breath, or chest pain  1) How long have you had palpitations/irregular HR/ Afib? Are you having the symptoms now? 12 days ago  2) Are you currently experiencing lightheadedness, SOB or CP? - a little short of breath- not in distress with the Afib  3) Do you have a history of afib (atrial fibrillation) or irregular heart rhythm? Yes- Afib  4) Have you checked your BP or HR? (document readings if available): yes averaging between 124 and 137 over 79  5) Are you experiencing any other symptoms?  No- pt wanted to have an appt to see Dr Lovena Le- I made her one for 08-27-19

## 2019-08-25 ENCOUNTER — Telehealth: Payer: Self-pay

## 2019-08-26 ENCOUNTER — Other Ambulatory Visit: Payer: Self-pay

## 2019-08-26 ENCOUNTER — Encounter (HOSPITAL_COMMUNITY): Payer: Self-pay | Admitting: Physician Assistant

## 2019-08-26 ENCOUNTER — Ambulatory Visit (HOSPITAL_COMMUNITY)
Admission: RE | Admit: 2019-08-26 | Discharge: 2019-08-26 | Disposition: A | Discharge status: Home / Self Care | Payer: Medicare Other | Source: Ambulatory Visit | Attending: Physician Assistant | Admitting: Physician Assistant

## 2019-08-26 VITALS — BP 130/72 | HR 117 | Ht 62.0 in | Wt 124.4 lb

## 2019-08-26 DIAGNOSIS — Z95810 Presence of automatic (implantable) cardiac defibrillator: Secondary | ICD-10-CM | POA: Insufficient documentation

## 2019-08-26 DIAGNOSIS — Z8249 Family history of ischemic heart disease and other diseases of the circulatory system: Secondary | ICD-10-CM | POA: Insufficient documentation

## 2019-08-26 DIAGNOSIS — D6869 Other thrombophilia: Secondary | ICD-10-CM | POA: Diagnosis not present

## 2019-08-26 DIAGNOSIS — Z951 Presence of aortocoronary bypass graft: Secondary | ICD-10-CM | POA: Insufficient documentation

## 2019-08-26 DIAGNOSIS — I2510 Atherosclerotic heart disease of native coronary artery without angina pectoris: Secondary | ICD-10-CM | POA: Diagnosis not present

## 2019-08-26 DIAGNOSIS — I484 Atypical atrial flutter: Secondary | ICD-10-CM

## 2019-08-26 DIAGNOSIS — Z7901 Long term (current) use of anticoagulants: Secondary | ICD-10-CM | POA: Diagnosis not present

## 2019-08-26 DIAGNOSIS — Z87891 Personal history of nicotine dependence: Secondary | ICD-10-CM | POA: Diagnosis not present

## 2019-08-26 DIAGNOSIS — E039 Hypothyroidism, unspecified: Secondary | ICD-10-CM | POA: Diagnosis not present

## 2019-08-26 DIAGNOSIS — I10 Essential (primary) hypertension: Secondary | ICD-10-CM | POA: Diagnosis not present

## 2019-08-26 DIAGNOSIS — Z79890 Hormone replacement therapy (postmenopausal): Secondary | ICD-10-CM | POA: Insufficient documentation

## 2019-08-26 DIAGNOSIS — Z888 Allergy status to other drugs, medicaments and biological substances status: Secondary | ICD-10-CM | POA: Diagnosis not present

## 2019-08-26 DIAGNOSIS — Z885 Allergy status to narcotic agent status: Secondary | ICD-10-CM | POA: Insufficient documentation

## 2019-08-26 DIAGNOSIS — Z79899 Other long term (current) drug therapy: Secondary | ICD-10-CM | POA: Diagnosis not present

## 2019-08-26 DIAGNOSIS — E876 Hypokalemia: Secondary | ICD-10-CM | POA: Insufficient documentation

## 2019-08-26 DIAGNOSIS — I4819 Other persistent atrial fibrillation: Secondary | ICD-10-CM | POA: Diagnosis present

## 2019-08-26 DIAGNOSIS — E785 Hyperlipidemia, unspecified: Secondary | ICD-10-CM | POA: Diagnosis not present

## 2019-08-26 DIAGNOSIS — Z8674 Personal history of sudden cardiac arrest: Secondary | ICD-10-CM | POA: Diagnosis not present

## 2019-08-26 MED ORDER — PINDOLOL 5 MG PO TABS: 7.5000 mg | ORAL_TABLET | Freq: Every day | ORAL | 3 refills | Status: DC

## 2019-08-26 NOTE — Progress Notes (Addendum)
Primary Care Physician: Shon Baton, MD Primary Cardiologist: Dr Selena Batten  Primary Electrophysiologist: Dr Rayann Heman Referring Physician: [facility]   Nicole Hampton is a 74 y.o. female with a history of VT and VF arrest s/p ICD, paroxysmal afib, HLD, HTN, CAD s/p CABG, and MVR. Patient was noted to be in persistent AF since 01/03/19. She reports that she has been unaware of her arrhythmia with minimal fatigue. She had recently started a supplement called Preservision for eye health but that is the only change recently. She is no longer taking it. No other triggers. She denies snoring or alcohol use. Patient is s/p DCCV on 02/11/19. She is on Eliquis for a CHADS2VASC score of 4.   On follow up today, patient reports that her heart rates at home have been in the 100-130 range. She does have minimal dyspnea with exertion but overall she is unaware of her arrhythmia.  Today, she denies symptoms of palpitations, chest pain, orthopnea, PND, lower extremity edema,  presyncope, syncope, snoring, daytime somnolence, bleeding, or neurologic sequela. The patient is tolerating medications without difficulties and is otherwise without complaint today.    Atrial Fibrillation Risk Factors:  she does not have symptoms or diagnosis of sleep apnea. she does not have a history of alcohol use. The patient does not have a history of early familial atrial fibrillation or other arrhythmias.  she has a BMI of Body mass index is 22.75 kg/m.[name] Filed Weights   08/26/19 1433  Weight: 56.4 kg    Family History  Problem Relation Age of Onset  . Arthritis Mother 64       DROWNING WITH HURRICANE [name]  . Hypertension Father 34       DROWNING WITH HURRICANE [name]  . ALS Sister   . Pancreatic cancer Brother   . Macular degeneration Brother      Atrial Fibrillation Management history:  Previous antiarrhythmic drugs: none Previous cardioversions: 02/11/19 Previous ablations: none CHADS2VASC score:  4 Anticoagulation history: Eliquis   Past Medical History:  Diagnosis Date  . A-fib ([place])   . BCC (basal cell carcinoma)    SEES DERM.  [name] Complication of anesthesia    extremem nausea and vomiting  . Hyperlipidemia    WITH ELEVATED LDL/HDL CLEAN CORS 2006. DR. Norville Haggard  . Hypertension   . Hypokalemia    CHRONIC  . Hypothyroidism   . Lactose intolerance   . OA (osteoarthritis)    HANDS AND FEET  . Pancreatitis 1982   GB  . S/P cholecystectomy    OPEN  . Sudden cardiac death ([place]) [place]. AICD  . Vertigo    RECURRENT   Past Surgical History:  Procedure Laterality Date  . CARDIOVERSION N/A 02/11/2019   Procedure: CARDIOVERSION;  Surgeon: Elouise Munroe, MD;  Location: [facility] ENDOSCOPY;  Service: Cardiovascular;  Laterality: N/A;  . CESAREAN SECTION     x1  . ICD GENERATOR CHANGE  05/25/2014   MDT Gwyneth Revels S DR ICD implanted by Dr Rosita Fire (secondary prevention)  . 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 OPEN HEART SURGERY WITH DEFIB  2006   1996    Current Outpatient Medications  Medication Sig Dispense Refill  . aMILoride (MIDAMOR) 5 MG tablet Take 5 mg by mouth daily.    [name] amoxicillin (AMOXIL) 500 MG capsule Take 2,000 mg by mouth See admin instructions. Take 2000 mg 1  hour prior to dental work    . Ascorbic Acid (VITAMIN C PO) Take 1 tablet by mouth daily.    [name] azelastine (ASTELIN) 0.1 % nasal spray Place 1 spray into both nostrils 2 (two) times daily as needed for rhinitis. Use in each nostril as directed    . Calcium Carbonate-Vitamin D (CALTRATE 600+D PO) Take 3 tablets by mouth daily.     [name] ELIQUIS 5 MG TABS tablet TAKE ONE TABLET TWICE DAILY 60 tablet 6  . estradiol (ESTRACE) 2 MG tablet Take 1 mg by mouth at bedtime.    [name] ezetimibe (ZETIA) 10 MG tablet Take 10 mg by mouth daily.    . fexofenadine (ALLEGRA) 180 MG tablet Take 180 mg by mouth daily as needed  for allergies or rhinitis.    . fluticasone (FLONASE) 50 MCG/ACT nasal spray Place 2 sprays into both nostrils daily as needed for allergies.     [name] levothyroxine (SYNTHROID) 25 MCG tablet Take 25 mcg by mouth daily before breakfast.    . Magnesium Cl-Calcium Carbonate (SLOW-MAG PO) Take 1 tablet by mouth daily.    . meclizine (ANTIVERT) 12.5 MG tablet Take 12.5 mg by mouth 3 (three) times daily as needed for dizziness.    . Multiple Vitamin (MULTIVITAMIN) tablet Take 1 tablet by mouth once a week.     . omega-3 acid ethyl esters (LOVAZA) 1 g capsule Take 1 g by mouth daily.     . pindolol (VISKEN) 5 MG tablet Take 1.5 tablets (7.5 mg total) by mouth daily. 135 tablet 3  . potassium chloride (MICRO-K) 10 MEQ CR capsule Take 30-40 mEq by mouth See admin instructions. Take 40 meq in the morning and 30 meq at night    . temazepam (RESTORIL) 7.5 MG capsule Take 7.5 mg by mouth at bedtime as needed for sleep.      No current facility-administered medications for this encounter.    Allergies  Allergen Reactions  . Cefuroxime Axetil Palpitations  . Codeine Nausea And Vomiting and Other (See Comments)    Gi Upset   . Morphine Palpitations and Rash  . Quinidine Gluconate Other (See Comments)    Positive ANA, tachycardia  . Other Nausea And Vomiting    general anesthesia - severe nausea and vomiting   . Latex Rash    Social History   Socioeconomic History  . Marital status: Married    Spouse name: [place]  . Number of children: 2  . Years of education: Not on file  . Highest education level: Not on file  Occupational History  . Occupation: [employer]  Tobacco Use  . Smoking status: Former Smoker    Quit date: 06/23/1975    Years since quitting: 44.2  . Smokeless tobacco: Never Used  Substance and Sexual Activity  . Alcohol use: Not Currently  . Drug use: No  . Sexual activity: Not on file  Other Topics Concern  . Not on file  Social History Narrative  . Not on file    Social Determinants of Health   Financial Resource Strain:   . Difficulty of Paying Living Expenses: Not on file  Food Insecurity:   . Worried About Charity fundraiser in the Last Year: Not on file  . Ran Out of Food in the Last Year: Not on file  Transportation Needs:   . Lack of Transportation (Medical): Not on file  . Lack of Transportation (Non-Medical): Not on file  Physical Activity:   . Days of  Exercise per Week: Not on file  . Minutes of Exercise per Session: Not on file  Stress:   . Feeling of Stress : Not on file  Social Connections:   . Frequency of Communication with Friends and Family: Not on file  . Frequency of Social Gatherings with Friends and Family: Not on file  . Attends Religious Services: Not on file  . Active Member of Clubs or Organizations: Not on file  . Attends Archivist Meetings: Not on file  . Marital Status: Not on file  Intimate Partner Violence:   . Fear of Current or Ex-Partner: Not on file  . Emotionally Abused: Not on file  . Physically Abused: Not on file  . Sexually Abused: Not on file     ROS- All systems are reviewed and negative except as per the HPI above.  Physical Exam: Vitals:   08/26/19 1433  BP: 130/72  Pulse: (!) 117  SpO2: 98%  Weight: 56.4 kg  Height: 5\' 2"  (1.575 m)    GEN- The patient is well appearing, alert and oriented x 3 today.   HEENT-head normocephalic, atraumatic, sclera clear, conjunctiva pink, hearing intact, trachea midline. Lungs- Clear to ausculation bilaterally, normal work of breathing Heart- Regular rhythm, tachycardia, no murmurs, rubs or gallops  GI- soft, NT, ND, + BS Extremities- no clubbing, cyanosis, or edema MS- no significant deformity or atrophy Skin- no rash or lesion Psych- euthymic mood, full affect Neuro- strength and sensation are intact   Wt Readings from Last 3 Encounters:  08/26/19 56.4 kg  08/12/19 55.3 kg  05/22/19 54.4 kg    EKG today demonstrates atypical  atrial flutter vs atrial tachycardia HR 117, QRS 84, QTc 457  Echo 12/09/18 demonstrated  1. The left ventricle has normal systolic function with an ejection fraction of 60-65%. The cavity size was normal. Left ventricular diastolic Doppler parameters are indeterminate.  2. The right ventricle has normal systolic function. The cavity was normal.  3. Left atrial size was mildly dilated.  4. Mild thickening of the mitral valve leaflet. Mild mitral valve stenosis.  5. The tricuspid valve is grossly normal.  6. The aortic valve is tricuspid. Mild thickening of the aortic valve.  7. Akinesis of the basal inferior wall with overall normal LV function; s/p MV repair with mean gradient 11 mmHg; MVA 1.8 cm2 by pressure halftime; likely mild MS; mild LAE; mild TR with mild pulmonary hypertension.  Epic records are reviewed at length today  Assessment and Plan:  1. Persistent atrial fibrillation/atrial flutter S/p DCCV 02/11/19.  We discussed therapeutic options again today including various AAD +/- DCCV. For now, patient prefers a conservative approach with rate control.  Continue Eliquis 5 mg BID Increase pindolol to 7.5 mg daily for better rate control.  This patients CHA2DS2-VASc Score and unadjusted Ischemic Stroke Rate (% per year) is equal to 4.8 % stroke rate/year from a score of 4  Above score calculated as 1 point each if present [CHF, HTN, DM, Vascular=MI/PAD/Aortic Plaque, Age if 65-74, or Female] Above score calculated as 2 points each if present [Age > 75, or Stroke/TIA/TE]   2. HTN Stable, med changes as above.  3. CAD S/p CABG 2006.  No anginal symptoms.  4. VF arrest/NSVT S/p ICD, followed by Dr Rayann Heman and the device clinic.   Follow up with Dr Rayann Heman as scheduled per patient request.    Adline Peals PA-C [facility] [address] [place],  [address] [phone]

## 2019-08-26 NOTE — Patient Instructions (Signed)
Increase pindolol to 7.5mg  once a day

## 2019-08-27 ENCOUNTER — Telehealth (INDEPENDENT_AMBULATORY_CARE_PROVIDER_SITE_OTHER): Payer: Medicare Other | Admitting: Internal Medicine

## 2019-08-27 ENCOUNTER — Encounter: Payer: Self-pay | Admitting: Internal Medicine

## 2019-08-27 DIAGNOSIS — Z9889 Other specified postprocedural states: Secondary | ICD-10-CM | POA: Diagnosis not present

## 2019-08-27 DIAGNOSIS — I4901 Ventricular fibrillation: Secondary | ICD-10-CM

## 2019-08-27 DIAGNOSIS — I484 Atypical atrial flutter: Secondary | ICD-10-CM | POA: Diagnosis not present

## 2019-08-27 DIAGNOSIS — D6869 Other thrombophilia: Secondary | ICD-10-CM

## 2019-08-27 DIAGNOSIS — Z8679 Personal history of other diseases of the circulatory system: Secondary | ICD-10-CM | POA: Diagnosis not present

## 2019-08-27 DIAGNOSIS — I4819 Other persistent atrial fibrillation: Secondary | ICD-10-CM | POA: Diagnosis not present

## 2019-08-27 NOTE — Progress Notes (Signed)
Electrophysiology TeleHealth Note   Due to national recommendations of social distancing due to COVID 19, an audio/video telehealth visit is felt to be most appropriate for this patient at this time.  See MyChart message from today for the patient's consent to telehealth for [facility].    Date:  08/27/2019   ID:  Nicole Hampton, DOB Dec 01, 1945, MRN [id number]  Location: patient's home  Provider location:  [facility]  Evaluation Performed: Follow-up visit  PCP:  Shon Baton, MD   Electrophysiologist:  Dr Rayann Heman  Chief Complaint:  AF/ device follow up  History of Present Illness:    Nicole Hampton is a 74 y.o. female who presents via telehealth conferencing today.  Since last being seen in our clinic, the patient reports doing reasonably well. She remains in atrial flutter by EKG yesterday. She has associated shortness of breath.  Today, she denies symptoms of palpitations, chest pain,  lower extremity edema, dizziness, presyncope, or syncope.  The patient is otherwise without complaint today.  The patient denies symptoms of fevers, chills, cough, or new SOB worrisome for COVID 19.  Past Medical History:  Diagnosis Date  . A-fib ([place])   . BCC (basal cell carcinoma)    SEES DERM.  [name] Complication of anesthesia    extremem nausea and vomiting  . Hyperlipidemia    WITH ELEVATED LDL/HDL CLEAN CORS 2006. DR. Norville Haggard  . Hypertension   . Hypokalemia    CHRONIC  . Hypothyroidism   . Lactose intolerance   . OA (osteoarthritis)    HANDS AND FEET  . Pancreatitis 1982   GB  . S/P cholecystectomy    OPEN  . 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 Vertigo    RECURRENT    Past Surgical History:  Procedure Laterality Date  . CARDIOVERSION N/A 02/11/2019   Procedure: CARDIOVERSION;  Surgeon: Elouise Munroe, MD;  Location: [facility] ENDOSCOPY;  Service: Cardiovascular;  Laterality: N/A;  . CESAREAN SECTION     x1  . ICD GENERATOR CHANGE   05/25/2014   MDT Gwyneth Revels S DR ICD implanted by Dr Rosita Fire (secondary prevention)  . 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 OPEN HEART SURGERY WITH DEFIB  2006   1996    Current Outpatient Medications  Medication Sig Dispense Refill  . aMILoride (MIDAMOR) 5 MG tablet Take 5 mg by mouth daily.    [name] amoxicillin (AMOXIL) 500 MG capsule Take 2,000 mg by mouth See admin instructions. Take 2000 mg 1 hour prior to dental work    . Ascorbic Acid (VITAMIN C PO) Take 1 tablet by mouth daily.    [name] azelastine (ASTELIN) 0.1 % nasal spray Place 1 spray into both nostrils 2 (two) times daily as needed for rhinitis. Use in each nostril as directed    . Calcium Carbonate-Vitamin D (CALTRATE 600+D PO) Take 3 tablets by mouth daily.     [name] ELIQUIS 5 MG TABS tablet TAKE ONE TABLET TWICE DAILY 60 tablet 6  . estradiol (ESTRACE) 2 MG tablet Take 1 mg by mouth at bedtime.    [name] ezetimibe (ZETIA) 10 MG tablet Take 10 mg by mouth daily.    . fexofenadine (ALLEGRA) 180 MG tablet Take 180 mg by mouth daily as needed for allergies or rhinitis.    . fluticasone (FLONASE) 50 MCG/ACT nasal spray Place 2 sprays into both  nostrils daily as needed for allergies.     [name] levothyroxine (SYNTHROID) 25 MCG tablet Take 25 mcg by mouth daily before breakfast.    . Magnesium Cl-Calcium Carbonate (SLOW-MAG PO) Take 1 tablet by mouth daily.    . meclizine (ANTIVERT) 12.5 MG tablet Take 12.5 mg by mouth 3 (three) times daily as needed for dizziness.    . Multiple Vitamin (MULTIVITAMIN) tablet Take 1 tablet by mouth once a week.     . omega-3 acid ethyl esters (LOVAZA) 1 g capsule Take 1 g by mouth daily.     . pindolol (VISKEN) 5 MG tablet Take 1.5 tablets (7.5 mg total) by mouth daily. 135 tablet 3  . potassium chloride (MICRO-K) 10 MEQ CR capsule Take 30-40 mEq by mouth See admin instructions. Take 40 meq in the morning and 30 meq at night    . temazepam  (RESTORIL) 7.5 MG capsule Take 7.5 mg by mouth at bedtime as needed for sleep.      No current facility-administered medications for this visit.    Allergies:   Cefuroxime axetil, Codeine, Morphine, Quinidine gluconate, Other, and Latex   Social History:  The patient  reports that she quit smoking about 44 years ago. She has never used smokeless tobacco. She reports previous alcohol use. She reports that she does not use drugs.   Family History:  The patient's  family history includes ALS in her sister; Arthritis (age of onset: 23) in her mother; Hypertension (age of onset: 25) in her father; Macular degeneration in her brother; Pancreatic cancer in her brother.   ROS:  Please see the history of present illness.   All other systems are personally reviewed and negative.    Exam:    Vital Signs:  There were no vitals taken for this visit.  Well sounding and appearing, alert and conversant, regular work of breathing,  good skin color Eyes- anicteric, neuro- grossly intact, skin- no apparent rash or lesions or cyanosis, mouth- oral mucosa is pink  Labs/Other Tests and Data Reviewed:    Recent Labs: 01/29/2019: BUN 17; Creatinine, Ser 0.68; Hemoglobin 12.4; Platelets 223; Potassium 4.6; Sodium 141   Wt Readings from Last 3 Encounters:  08/26/19 124 lb 6.4 oz (56.4 kg)  08/12/19 122 lb (55.3 kg)  05/22/19 120 lb (54.4 kg)     Last device remote is reviewed from [employer] which reveals normal device function   ASSESSMENT & PLAN:    1.  Persistent atrial fibrillation and atrial flutter CHADS2VASC is 3 - Continue Eliquis  With prior rheumatic fever, mitral valve surgery, and also ablations, her success rates with ablation are decreased. She also reports prior ablation with Dr Rosita Fire; unfortunately, no records available.  Treatment options reviewed with patient today including AAD therapy vs ablation vs pace termination of her arrhythmia. She is cleat that she does not wish to take  AADs.  She is not super excited about ablation. At this time, she would prefer to turn on atrial ATP therapy and see how this works.  Will plan to have her come into the office for [place] programming when I am in the office next Monday.  I have advised that she remain compliant with her eliquis and not miss any doses in the interim.  2.  Prior VF arrest No recent arrhythmias Recent remote reviewed RV lead threshold chronically elevated  3.  S/p MV repair Echo last year reviewed  Follow-up:  Monday for device reprogramming   Patient Risk:  after full review of this patients clinical status, I feel that they are at moderate risk at this time.  Today, I have spent 15 minutes with the patient with telehealth technology discussing arrhythmia management .    Army Fossa, MD  08/27/2019 12:28 PM     [facility] [address] [place] [address] [phone] (office) [phone] (fax)

## 2019-08-31 ENCOUNTER — Ambulatory Visit: Payer: Medicare Other

## 2019-09-01 ENCOUNTER — Ambulatory Visit: Payer: Medicare Other | Admitting: Internal Medicine

## 2019-09-01 ENCOUNTER — Encounter: Payer: Self-pay | Admitting: Internal Medicine

## 2019-09-01 ENCOUNTER — Other Ambulatory Visit: Payer: Self-pay

## 2019-09-01 VITALS — BP 112/80 | HR 113 | Ht 62.0 in | Wt 122.0 lb

## 2019-09-01 DIAGNOSIS — Z95810 Presence of automatic (implantable) cardiac defibrillator: Secondary | ICD-10-CM | POA: Diagnosis not present

## 2019-09-01 DIAGNOSIS — D6869 Other thrombophilia: Secondary | ICD-10-CM | POA: Diagnosis not present

## 2019-09-01 DIAGNOSIS — I4819 Other persistent atrial fibrillation: Secondary | ICD-10-CM

## 2019-09-01 DIAGNOSIS — I4901 Ventricular fibrillation: Secondary | ICD-10-CM | POA: Diagnosis not present

## 2019-09-01 LAB — CUP PACEART INCLINIC DEVICE CHECK
Battery Remaining Longevity: 23 mo
Battery Voltage: 2.9 V
Brady Statistic AP VP Percent: 0.18 %
Brady Statistic AP VS Percent: 77.36 %
Brady Statistic AS VP Percent: 2.85 %
Brady Statistic AS VS Percent: 19.61 %
Brady Statistic RA Percent Paced: 75.72 %
Brady Statistic RV Percent Paced: 3.22 %
Date Time Interrogation Session: 20210208130719
HighPow Impedance: 35 Ohm
HighPow Impedance: 47 Ohm
Implantable Lead Implant Date: 20030319
Implantable Lead Implant Date: 20141103
Implantable Lead Location: 753859
Implantable Lead Location: 753860
Implantable Lead Model: 5076
Implantable Lead Model: 6947
Implantable Pulse Generator Implant Date: 20171103
Lead Channel Impedance Value: 418 Ohm
Lead Channel Impedance Value: 608 Ohm
Lead Channel Impedance Value: 646 Ohm
Lead Channel Pacing Threshold Amplitude: 0.75 V
Lead Channel Pacing Threshold Amplitude: 2.5 V
Lead Channel Pacing Threshold Pulse Width: 0.4 ms
Lead Channel Pacing Threshold Pulse Width: 1 ms
Lead Channel Sensing Intrinsic Amplitude: 0.375 mV
Lead Channel Sensing Intrinsic Amplitude: 3.125 mV
Lead Channel Sensing Intrinsic Amplitude: 3.3 mV
Lead Channel Setting Pacing Amplitude: 2 V
Lead Channel Setting Pacing Amplitude: 4.5 V
Lead Channel Setting Pacing Pulse Width: 1 ms
Lead Channel Setting Sensing Sensitivity: 0.45 mV

## 2019-09-01 NOTE — Progress Notes (Signed)
PCP: Nicole Baton, MD Primary Cardiologist: Nicole Nicole Hampton Primary EP:  Nicole Nicole Hampton  Nicole Hampton is a 74 y.o. female who presents today for routine electrophysiology followup.  Since last being seen in our clinic, the patient reports doing reasonably well.  She remains in atypical atrial flutter. Today, she denies symptoms of palpitations, chest pain, shortness of breath,  lower extremity edema, dizziness, presyncope, or syncope.  The patient is otherwise without complaint today.   Past Medical History:  Diagnosis Date  . BCC (basal cell carcinoma)    SEES DERM.  [name] Complication of anesthesia    extremem nausea and vomiting  . Hyperlipidemia    WITH ELEVATED LDL/HDL CLEAN CORS 2006. Nicole. Norville Hampton  . Hypertension   . Hypokalemia    CHRONIC  . Hypothyroidism   . Lactose intolerance   . OA (osteoarthritis)    HANDS AND FEET  . Pancreatitis 1982   GB  . Persistent atrial fibrillation ([facility])    also typical atrial flutter; she reports prior ablations with Nicole Hampton, no records available  . Rheumatic fever    as a child  . S/P cholecystectomy    OPEN  . Sudden cardiac death ([place]) [name]. AICD  . Vertigo    RECURRENT   Past Surgical History:  Procedure Laterality Date  . CARDIOVERSION N/A 02/11/2019   Procedure: CARDIOVERSION;  Surgeon: Nicole Munroe, MD;  Location: [facility] ENDOSCOPY;  Service: Cardiovascular;  Laterality: N/A;  . CESAREAN SECTION     x1  . ICD GENERATOR CHANGE  05/25/2014   MDT Nicole Hampton S Nicole ICD implanted by Nicole Hampton (secondary prevention)  . 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 OPEN HEART SURGERY WITH DEFIB  2006   1996    ROS- all systems are reviewed and negative except as per HPI above  Current Outpatient Medications  Medication Sig Dispense Refill  . aMILoride (MIDAMOR) 5 MG tablet Take 5 mg by mouth daily.    [name] amoxicillin (AMOXIL) 500 MG  capsule Take 2,000 mg by mouth See admin instructions. Take 2000 mg 1 hour prior to dental work    . Ascorbic Acid (VITAMIN C PO) Take 1 tablet by mouth daily.    [name] azelastine (ASTELIN) 0.1 % nasal spray Place 1 spray into both nostrils 2 (two) times daily as needed for rhinitis. Use in each nostril as directed    . Calcium Carbonate-Vitamin D (CALTRATE 600+D PO) Take 3 tablets by mouth daily.     [name] ELIQUIS 5 MG TABS tablet TAKE ONE TABLET TWICE DAILY 60 tablet 6  . estradiol (ESTRACE) 2 MG tablet Take 1 mg by mouth at bedtime.    [name] ezetimibe (ZETIA) 10 MG tablet Take 10 mg by mouth daily.    . fexofenadine (ALLEGRA) 180 MG tablet Take 180 mg by mouth daily as needed for allergies or rhinitis.    . fluticasone (FLONASE) 50 MCG/ACT nasal spray Place 2 sprays into both nostrils daily as needed for allergies.     [name] levothyroxine (SYNTHROID) 25 MCG tablet Take 25 mcg by mouth daily before breakfast.    . Magnesium Cl-Calcium Carbonate (SLOW-MAG PO) Take 1 tablet by mouth daily.    . meclizine (ANTIVERT) 12.5 MG tablet Take 12.5 mg by mouth 3 (three) times daily as needed for dizziness.    . Multiple Vitamin (MULTIVITAMIN) tablet Take 1 tablet by  mouth once a week.     . omega-3 acid ethyl esters (LOVAZA) 1 g capsule Take 1 g by mouth daily.     . pindolol (VISKEN) 5 MG tablet Take 1.5 tablets (7.5 mg total) by mouth daily. 135 tablet 3  . potassium chloride (MICRO-K) 10 MEQ CR capsule Take 30-40 mEq by mouth See admin instructions. Take 40 meq in the morning and 30 meq at night    . temazepam (RESTORIL) 7.5 MG capsule Take 7.5 mg by mouth at bedtime as needed for sleep.      No current facility-administered medications for this visit.    Physical Exam: Vitals:   09/01/19 1208  BP: 112/80  Pulse: (!) 113  SpO2: 90%  Weight: 122 lb (55.3 kg)  Height: 5\' 2"  (1.575 m)    GEN- The patient is well appearing, alert and oriented x 3 today.   Head- normocephalic, atraumatic Eyes-  Sclera clear,  conjunctiva pink Ears- hearing intact Oropharynx- clear Lungs-   normal work of breathing Chest- pacemaker pocket is well healed Heart- Regular rate and rhythm  Extremities- no clubbing, cyanosis, or edema  Pacemaker interrogation- reviewed in detail today,  See PACEART report  ekg tracing ordered today is personally reviewed and shows atypical appearing atrial flutter  Assessment and Plan:  1. Persistent afib/ atypical atrial flutter Today, her ICD was reprogrammed to turn atrial therapies on.  This resolved in immediate detection and appropriate burst termination of her atrial flutter.  She remains in an atrial paced rhythm thereafter. Continue eliquis for chads2vasc score of 3 She wishes to continue her current approach at this time.  2. Prior VF arrest Normal ICD function as above  3. S/p MV repair No changes   Return to see me in 2-3 months  Nicole Grayer MD, [facility] 09/01/2019 12:21 PM

## 2019-09-01 NOTE — Patient Instructions (Addendum)
Medication Instructions:  Your physician recommends that you continue on your current medications as directed. Please refer to the Current Medication list given to you today.  Labwork: None ordered.  Testing/Procedures: None ordered.  Follow-Up: Keep your scheduled follow up with Afib clinic.  Remote monitoring is used to monitor your ICD from home. This monitoring reduces the number of office visits required to check your device to one time per year. It allows [place] to keep an eye on the functioning of your device to ensure it is working properly. You are scheduled for a device check from home on 10/06/2019. You may send your transmission at any time that day. If you have a wireless device, the transmission will be sent automatically. After your physician reviews your transmission, you will receive a postcard with your next transmission date.  Any Other Special Instructions Will Be Listed Below (If Applicable).  If you need a refill on your cardiac medications before your next appointment, please call your pharmacy.

## 2019-09-02 ENCOUNTER — Ambulatory Visit: Payer: Medicare Other

## 2019-09-04 ENCOUNTER — Ambulatory Visit: Payer: Medicare Other

## 2019-09-05 ENCOUNTER — Ambulatory Visit: Payer: Medicare Other

## 2019-09-15 ENCOUNTER — Ambulatory Visit: Payer: Medicare Other

## 2019-09-16 ENCOUNTER — Ambulatory Visit: Payer: Medicare Other

## 2019-09-18 ENCOUNTER — Ambulatory Visit: Payer: Medicare Other | Attending: Internal Medicine

## 2019-09-18 DIAGNOSIS — Z23 Encounter for immunization: Secondary | ICD-10-CM | POA: Insufficient documentation

## 2019-09-18 NOTE — Progress Notes (Signed)
   Covid-19 Vaccination Clinic  Name:  Nicole Hampton    MRN: [id number] DOB: 1945/12/12  09/18/2019  [name] was observed post Covid-19 immunization for 15 minutes without incidence. She was provided with Vaccine Information Sheet and instruction to access the V-Safe system.   [name] was instructed to call 911 with any severe reactions post vaccine: [name] Difficulty breathing  . Swelling of your face and throat  . A fast heartbeat  . A bad rash all over your body  . Dizziness and weakness    Immunizations Administered    Name Date Dose VIS Date Route   Pfizer COVID-19 Vaccine 09/18/2019  2:59 PM 0.3 mL 07/04/2019 Intramuscular   Manufacturer: [place]   Lot: [id number]   [place]: [id number]

## 2019-10-06 ENCOUNTER — Ambulatory Visit (INDEPENDENT_AMBULATORY_CARE_PROVIDER_SITE_OTHER): Payer: Medicare Other | Admitting: *Deleted

## 2019-10-06 DIAGNOSIS — Z95810 Presence of automatic (implantable) cardiac defibrillator: Secondary | ICD-10-CM | POA: Diagnosis not present

## 2019-10-06 LAB — CUP PACEART REMOTE DEVICE CHECK
Battery Remaining Longevity: 21 mo
Battery Voltage: 2.93 V
Brady Statistic AP VP Percent: 0.15 %
Brady Statistic AP VS Percent: 99.22 %
Brady Statistic AS VP Percent: 0 %
Brady Statistic AS VS Percent: 0.63 %
Brady Statistic RA Percent Paced: 98.46 %
Brady Statistic RV Percent Paced: 0.17 %
Date Time Interrogation Session: 20210315001804
HighPow Impedance: 39 Ohm
HighPow Impedance: 53 Ohm
Implantable Lead Implant Date: 20030319
Implantable Lead Implant Date: 20141103
Implantable Lead Location: 753859
Implantable Lead Location: 753860
Implantable Lead Model: 5076
Implantable Lead Model: 6947
Implantable Pulse Generator Implant Date: 20171103
Lead Channel Impedance Value: 418 Ohm
Lead Channel Impedance Value: 703 Ohm
Lead Channel Impedance Value: 779 Ohm
Lead Channel Pacing Threshold Amplitude: 0.75 V
Lead Channel Pacing Threshold Amplitude: 2.5 V
Lead Channel Pacing Threshold Pulse Width: 0.4 ms
Lead Channel Pacing Threshold Pulse Width: 0.4 ms
Lead Channel Sensing Intrinsic Amplitude: 0.375 mV
Lead Channel Sensing Intrinsic Amplitude: 0.375 mV
Lead Channel Sensing Intrinsic Amplitude: 3 mV
Lead Channel Sensing Intrinsic Amplitude: 3 mV
Lead Channel Setting Pacing Amplitude: 2 V
Lead Channel Setting Pacing Amplitude: 4.5 V
Lead Channel Setting Pacing Pulse Width: 1 ms
Lead Channel Setting Sensing Sensitivity: 0.45 mV

## 2019-10-06 NOTE — Progress Notes (Signed)
ICD Remote  

## 2019-10-14 ENCOUNTER — Ambulatory Visit: Payer: Medicare Other | Attending: Internal Medicine

## 2019-10-14 DIAGNOSIS — Z23 Encounter for immunization: Secondary | ICD-10-CM

## 2019-10-14 NOTE — Progress Notes (Signed)
   Covid-19 Vaccination Clinic  Name:  Nicole Hampton    MRN: [id number] DOB: [birth year]  10/14/2019  [name] was observed post Covid-19 immunization for 15 minutes without incident. She was provided with Vaccine Information Sheet and instruction to access the V-Safe system.   [name] was instructed to call 911 with any severe reactions post vaccine: [name] Difficulty breathing  . Swelling of face and throat  . A fast heartbeat  . A bad rash all over body  . Dizziness and weakness   Immunizations Administered    Name Date Dose VIS Date Route   Pfizer COVID-19 Vaccine 10/14/2019  9:44 AM 0.3 mL 07/04/2019 Intramuscular   Manufacturer: [place]   Lot: [id number]   [place]: [id number]      Covid-19 Vaccination Clinic  Name:  Nicole Hampton    MRN: [id number] DOB: [birth year]  10/14/2019  [name] was observed post Covid-19 immunization for 15 minutes without incident. She was provided with Vaccine Information Sheet and instruction to access the V-Safe system.   [name] was instructed to call 911 with any severe reactions post vaccine: [name] Difficulty breathing  . Swelling of face and throat  . A fast heartbeat  . A bad rash all over body  . Dizziness and weakness   Immunizations Administered    Name Date Dose VIS Date Route   Pfizer COVID-19 Vaccine 10/14/2019  9:44 AM 0.3 mL 07/04/2019 Intramuscular   Manufacturer: [place]   Lot: [id number]   [place]: [id number]

## 2019-11-20 ENCOUNTER — Ambulatory Visit (HOSPITAL_COMMUNITY)
Admission: RE | Admit: 2019-11-20 | Discharge: 2019-11-20 | Disposition: A | Discharge status: Home / Self Care | Payer: Medicare Other | Source: Ambulatory Visit | Attending: Physician Assistant | Admitting: Physician Assistant

## 2019-11-20 ENCOUNTER — Other Ambulatory Visit: Payer: Self-pay

## 2019-11-20 ENCOUNTER — Encounter (HOSPITAL_COMMUNITY): Payer: Self-pay | Admitting: Physician Assistant

## 2019-11-20 VITALS — BP 110/68 | HR 72 | Ht 62.0 in | Wt 118.6 lb

## 2019-11-20 DIAGNOSIS — D6869 Other thrombophilia: Secondary | ICD-10-CM

## 2019-11-20 DIAGNOSIS — Z8249 Family history of ischemic heart disease and other diseases of the circulatory system: Secondary | ICD-10-CM | POA: Insufficient documentation

## 2019-11-20 DIAGNOSIS — Z951 Presence of aortocoronary bypass graft: Secondary | ICD-10-CM | POA: Insufficient documentation

## 2019-11-20 DIAGNOSIS — Z820 Family history of epilepsy and other diseases of the nervous system: Secondary | ICD-10-CM | POA: Diagnosis not present

## 2019-11-20 DIAGNOSIS — Z885 Allergy status to narcotic agent status: Secondary | ICD-10-CM | POA: Insufficient documentation

## 2019-11-20 DIAGNOSIS — E039 Hypothyroidism, unspecified: Secondary | ICD-10-CM | POA: Diagnosis not present

## 2019-11-20 DIAGNOSIS — Z7901 Long term (current) use of anticoagulants: Secondary | ICD-10-CM | POA: Diagnosis not present

## 2019-11-20 DIAGNOSIS — E785 Hyperlipidemia, unspecified: Secondary | ICD-10-CM | POA: Insufficient documentation

## 2019-11-20 DIAGNOSIS — I2510 Atherosclerotic heart disease of native coronary artery without angina pectoris: Secondary | ICD-10-CM | POA: Diagnosis not present

## 2019-11-20 DIAGNOSIS — Z85828 Personal history of other malignant neoplasm of skin: Secondary | ICD-10-CM | POA: Diagnosis not present

## 2019-11-20 DIAGNOSIS — Z888 Allergy status to other drugs, medicaments and biological substances status: Secondary | ICD-10-CM | POA: Diagnosis not present

## 2019-11-20 DIAGNOSIS — I4892 Unspecified atrial flutter: Secondary | ICD-10-CM | POA: Insufficient documentation

## 2019-11-20 DIAGNOSIS — Z79890 Hormone replacement therapy (postmenopausal): Secondary | ICD-10-CM | POA: Insufficient documentation

## 2019-11-20 DIAGNOSIS — I10 Essential (primary) hypertension: Secondary | ICD-10-CM | POA: Diagnosis not present

## 2019-11-20 DIAGNOSIS — Z8261 Family history of arthritis: Secondary | ICD-10-CM | POA: Diagnosis not present

## 2019-11-20 DIAGNOSIS — I4819 Other persistent atrial fibrillation: Secondary | ICD-10-CM | POA: Diagnosis not present

## 2019-11-20 DIAGNOSIS — Z800 Family history of malignant neoplasm of digestive organs: Secondary | ICD-10-CM | POA: Diagnosis not present

## 2019-11-20 DIAGNOSIS — Z91040 Latex allergy status: Secondary | ICD-10-CM | POA: Diagnosis not present

## 2019-11-20 DIAGNOSIS — Z8674 Personal history of sudden cardiac arrest: Secondary | ICD-10-CM | POA: Diagnosis not present

## 2019-11-20 DIAGNOSIS — Z87891 Personal history of nicotine dependence: Secondary | ICD-10-CM | POA: Insufficient documentation

## 2019-11-20 DIAGNOSIS — Z881 Allergy status to other antibiotic agents status: Secondary | ICD-10-CM | POA: Diagnosis not present

## 2019-11-20 DIAGNOSIS — I4891 Unspecified atrial fibrillation: Secondary | ICD-10-CM | POA: Diagnosis present

## 2019-11-20 DIAGNOSIS — Z79899 Other long term (current) drug therapy: Secondary | ICD-10-CM | POA: Insufficient documentation

## 2019-11-20 DIAGNOSIS — Z95810 Presence of automatic (implantable) cardiac defibrillator: Secondary | ICD-10-CM | POA: Diagnosis not present

## 2019-11-20 NOTE — Progress Notes (Signed)
Primary Care Physician: Shon Baton, MD Primary Cardiologist: Dr Selena Batten  Primary Electrophysiologist: Dr Rayann Heman Referring Physician: [facility]   Nicole Hampton is a 74 y.o. female with a history of VT and VF arrest s/p ICD, paroxysmal afib, HLD, HTN, CAD s/p CABG, and MVR. Patient was noted to be in persistent AF since 01/03/19. She reports that she has been unaware of her arrhythmia with minimal fatigue. She had recently started a supplement called Preservision for eye health but that is the only change recently. She is no longer taking it. No other triggers. She denies snoring or alcohol use. Patient is s/p DCCV on 02/11/19. She is on Eliquis for a CHADS2VASC score of 4.   On follow up today, patient reports she has done very well from a cardiac standpoint. Atrial therapies were turned on with great success. Afib burden on device now <0.1% on last device check. She did have one episode of heart racing which was short lived.   Today, she denies symptoms of palpitations, chest pain, orthopnea, PND, lower extremity edema,  presyncope, syncope, snoring, daytime somnolence, bleeding, or neurologic sequela. The patient is tolerating medications without difficulties and is otherwise without complaint today.    Atrial Fibrillation Risk Factors:  she does not have symptoms or diagnosis of sleep apnea. she does not have a history of alcohol use. The patient does not have a history of early familial atrial fibrillation or other arrhythmias.  she has a BMI of Body mass index is 21.69 kg/m.[name] Filed Weights   11/20/19 1526  Weight: 53.8 kg    Family History  Problem Relation Age of Onset  . Arthritis Mother 36       DROWNING WITH HURRICANE [name]  . Hypertension Father 69       DROWNING WITH HURRICANE [name]  . ALS Sister   . Pancreatic cancer Brother   . Macular degeneration Brother      Atrial Fibrillation Management history:  Previous antiarrhythmic drugs:  none Previous cardioversions: 02/11/19 Previous ablations: none CHADS2VASC score: 4 Anticoagulation history: Eliquis   Past Medical History:  Diagnosis Date  . BCC (basal cell carcinoma)    SEES DERM.  [name] Complication of anesthesia    extremem nausea and vomiting  . Hyperlipidemia    WITH ELEVATED LDL/HDL CLEAN CORS 2006. DR. Norville Haggard  . Hypertension   . Hypokalemia    CHRONIC  . Hypothyroidism   . Lactose intolerance   . OA (osteoarthritis)    HANDS AND FEET  . Pancreatitis 1982   GB  . Persistent atrial fibrillation (HCC)    also typical atrial flutter; she reports prior ablations with Dr Rosita Fire, no records available  . Rheumatic fever    as a child  . S/P cholecystectomy    OPEN  . Sudden cardiac death ([place]) [place]. AICD  . Vertigo    RECURRENT   Past Surgical History:  Procedure Laterality Date  . CARDIOVERSION N/A 02/11/2019   Procedure: CARDIOVERSION;  Surgeon: Elouise Munroe, MD;  Location: [facility] ENDOSCOPY;  Service: Cardiovascular;  Laterality: N/A;  . CESAREAN SECTION     x1  . ICD GENERATOR CHANGE  05/25/2014   MDT Gwyneth Revels S DR ICD implanted by Dr Rosita Fire (secondary prevention)  . 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 OPEN HEART SURGERY WITH DEFIB  2006   1996    Current  Outpatient Medications  Medication Sig Dispense Refill  . aMILoride (MIDAMOR) 5 MG tablet Take 5 mg by mouth daily.    [name] amoxicillin (AMOXIL) 500 MG capsule Take 2,000 mg by mouth See admin instructions. Take 2000 mg 1 hour prior to dental work    . Ascorbic Acid (VITAMIN C PO) Take 1 tablet by mouth daily.    [name] azelastine (ASTELIN) 0.1 % nasal spray Place 1 spray into both nostrils 2 (two) times daily as needed for rhinitis. Use in each nostril as directed    . Calcium Carbonate-Vitamin D (CALTRATE 600+D PO) Take 3 tablets by mouth daily.     [name] ELIQUIS 5 MG TABS tablet TAKE ONE  TABLET TWICE DAILY 60 tablet 6  . estradiol (ESTRACE) 2 MG tablet Take 1 mg by mouth at bedtime.    [name] ezetimibe (ZETIA) 10 MG tablet Take 10 mg by mouth daily.    . fexofenadine (ALLEGRA) 180 MG tablet Take 180 mg by mouth daily as needed for allergies or rhinitis.    . fluticasone (FLONASE) 50 MCG/ACT nasal spray Place 2 sprays into both nostrils daily as needed for allergies.     [name] levothyroxine (SYNTHROID) 25 MCG tablet Take 25 mcg by mouth daily before breakfast.    . Magnesium Cl-Calcium Carbonate (SLOW-MAG PO) Take 1 tablet by mouth daily.    . meclizine (ANTIVERT) 12.5 MG tablet Take 12.5 mg by mouth 3 (three) times daily as needed for dizziness.    . Multiple Vitamin (MULTIVITAMIN) tablet Take 1 tablet by mouth once a week.     . omega-3 acid ethyl esters (LOVAZA) 1 g capsule Take 1 g by mouth daily.     . pindolol (VISKEN) 5 MG tablet Take 1.5 tablets (7.5 mg total) by mouth daily. 135 tablet 3  . potassium chloride (MICRO-K) 10 MEQ CR capsule Take 30-40 mEq by mouth See admin instructions. Take 40 meq in the morning and 30 meq at night    . temazepam (RESTORIL) 7.5 MG capsule Take 7.5 mg by mouth at bedtime as needed for sleep.      No current facility-administered medications for this encounter.    Allergies  Allergen Reactions  . Cefuroxime Axetil Palpitations  . Codeine Nausea And Vomiting and Other (See Comments)    Gi Upset   . Morphine Palpitations and Rash  . Quinidine Gluconate Other (See Comments)    Positive ANA, tachycardia  . Other Nausea And Vomiting    general anesthesia - severe nausea and vomiting   . Latex Rash    Social History   Socioeconomic History  . Marital status: Married    Spouse name: [place]  . Number of children: 2  . Years of education: Not on file  . Highest education level: Not on file  Occupational History  . Occupation: [employer]  Tobacco Use  . Smoking status: Former Smoker    Quit date: 06/23/1975    Years since  quitting: 44.4  . Smokeless tobacco: Never Used  Substance and Sexual Activity  . Alcohol use: Not Currently  . Drug use: No  . Sexual activity: Not on file  Other Topics Concern  . Not on file  Social History Narrative  . Not on file   Social Determinants of Health   Financial Resource Strain:   . Difficulty of Paying Living Expenses:   Food Insecurity:   . Worried About Charity fundraiser in the Last Year:   . [employer]  in the Last Year:   Transportation Needs:   . Film/video editor (Medical):   [name] Lack of Transportation (Non-Medical):   Physical Activity:   . Days of Exercise per Week:   . Minutes of Exercise per Session:   Stress:   . Feeling of Stress :   Social Connections:   . Frequency of Communication with Friends and Family:   . Frequency of Social Gatherings with Friends and Family:   . Attends Religious Services:   . Active Member of Clubs or Organizations:   . Attends Archivist Meetings:   [name] Marital Status:   Intimate Partner Violence:   . Fear of Current or Ex-Partner:   . Emotionally Abused:   [name] Physically Abused:   . Sexually Abused:      ROS- All systems are reviewed and negative except as per the HPI above.  Physical Exam: Vitals:   11/20/19 1526  BP: 110/68  Pulse: 72  Weight: 53.8 kg  Height: 5\' 2"  (1.575 m)    GEN- The patient is well appearing, alert and oriented x 3 today.   HEENT-head normocephalic, atraumatic, sclera clear, conjunctiva pink, hearing intact, trachea midline. Lungs- Clear to ausculation bilaterally, normal work of breathing Heart- Regular rate and rhythm, no murmurs, rubs or gallops  GI- soft, NT, ND, + BS Extremities- no clubbing, cyanosis, or edema MS- no significant deformity or atrophy Skin- no rash or lesion Psych- euthymic mood, full affect Neuro- strength and sensation are intact   Wt Readings from Last 3 Encounters:  11/20/19 53.8 kg  09/01/19 55.3 kg  08/26/19 56.4 kg    EKG  today demonstrates A paced HR 72, 1st degree AV block, PR 234, QRS 92, QTc 455  Echo 12/09/18 demonstrated  1. The left ventricle has normal systolic function with an ejection fraction of 60-65%. The cavity size was normal. Left ventricular diastolic Doppler parameters are indeterminate.  2. The right ventricle has normal systolic function. The cavity was normal.  3. Left atrial size was mildly dilated.  4. Mild thickening of the mitral valve leaflet. Mild mitral valve stenosis.  5. The tricuspid valve is grossly normal.  6. The aortic valve is tricuspid. Mild thickening of the aortic valve.  7. Akinesis of the basal inferior wall with overall normal LV function; s/p MV repair with mean gradient 11 mmHg; MVA 1.8 cm2 by pressure halftime; likely mild MS; mild LAE; mild TR with mild pulmonary hypertension.  Epic records are reviewed at length today  Assessment and Plan:  1. Persistent atrial fibrillation/atrial flutter S/p DCCV 02/11/19.  Patient maintaining SR with atrial therapies. Continue Eliquis 5 mg BID Continue pindolol to 7.5 mg daily   This patients CHA2DS2-VASc Score and unadjusted Ischemic Stroke Rate (% per year) is equal to 4.8 % stroke rate/year from a score of 4  Above score calculated as 1 point each if present [CHF, HTN, DM, Vascular=MI/PAD/Aortic Plaque, Age if 65-74, or Female] Above score calculated as 2 points each if present [Age > 75, or Stroke/TIA/TE]  2. HTN Stable, no changes today.  3. CAD S/p CABG 2006.  No anginal symptoms.  4. VF arrest/NSVT S/p ICD, followed by Dr Rayann Heman and the device clinic.   Follow up with Dr Rayann Heman in 3 months.    [facility] [address] [place], [place] [address] [phone]

## 2020-01-05 ENCOUNTER — Ambulatory Visit (INDEPENDENT_AMBULATORY_CARE_PROVIDER_SITE_OTHER): Payer: Medicare Other | Admitting: *Deleted

## 2020-01-05 DIAGNOSIS — I4901 Ventricular fibrillation: Secondary | ICD-10-CM

## 2020-01-06 LAB — CUP PACEART REMOTE DEVICE CHECK
Battery Remaining Longevity: 22 mo
Battery Voltage: 2.92 V
Brady Statistic AP VP Percent: 0.22 %
Brady Statistic AP VS Percent: 98.95 %
Brady Statistic AS VP Percent: 0 %
Brady Statistic AS VS Percent: 0.83 %
Brady Statistic RA Percent Paced: 96.45 %
Brady Statistic RV Percent Paced: 0.25 %
Date Time Interrogation Session: 20210614043824
HighPow Impedance: 39 Ohm
HighPow Impedance: 51 Ohm
Implantable Lead Implant Date: 20030319
Implantable Lead Implant Date: 20141103
Implantable Lead Location: 753859
Implantable Lead Location: 753860
Implantable Lead Model: 5076
Implantable Lead Model: 6947
Implantable Pulse Generator Implant Date: 20171103
Lead Channel Impedance Value: 418 Ohm
Lead Channel Impedance Value: 608 Ohm
Lead Channel Impedance Value: 665 Ohm
Lead Channel Pacing Threshold Amplitude: 0.75 V
Lead Channel Pacing Threshold Amplitude: 2.5 V
Lead Channel Pacing Threshold Pulse Width: 0.4 ms
Lead Channel Pacing Threshold Pulse Width: 0.4 ms
Lead Channel Sensing Intrinsic Amplitude: 1.875 mV
Lead Channel Sensing Intrinsic Amplitude: 1.875 mV
Lead Channel Sensing Intrinsic Amplitude: 3.25 mV
Lead Channel Sensing Intrinsic Amplitude: 3.25 mV
Lead Channel Setting Pacing Amplitude: 2 V
Lead Channel Setting Pacing Amplitude: 4.5 V
Lead Channel Setting Pacing Pulse Width: 1 ms
Lead Channel Setting Sensing Sensitivity: 0.45 mV

## 2020-01-06 NOTE — Progress Notes (Signed)
Remote ICD transmission.   

## 2020-03-01 ENCOUNTER — Other Ambulatory Visit (HOSPITAL_COMMUNITY): Payer: Self-pay | Admitting: Physician Assistant

## 2020-03-19 ENCOUNTER — Telehealth: Payer: Self-pay

## 2020-03-19 NOTE — Telephone Encounter (Signed)
Spoke with pt regarding appt on 03/22/20. Pt stated she will check vitals prior to appt and did not have any questions at this time. Pt agreed and confirmed virtual visit.

## 2020-03-22 ENCOUNTER — Telehealth (INDEPENDENT_AMBULATORY_CARE_PROVIDER_SITE_OTHER): Payer: Medicare Other | Admitting: Internal Medicine

## 2020-03-22 ENCOUNTER — Other Ambulatory Visit: Payer: Self-pay

## 2020-03-22 DIAGNOSIS — I4901 Ventricular fibrillation: Secondary | ICD-10-CM

## 2020-03-22 DIAGNOSIS — I4819 Other persistent atrial fibrillation: Secondary | ICD-10-CM | POA: Diagnosis not present

## 2020-03-22 DIAGNOSIS — D6869 Other thrombophilia: Secondary | ICD-10-CM | POA: Diagnosis not present

## 2020-03-22 DIAGNOSIS — Z95810 Presence of automatic (implantable) cardiac defibrillator: Secondary | ICD-10-CM | POA: Diagnosis not present

## 2020-03-22 NOTE — Progress Notes (Addendum)
PCP: Shon Baton, MD Primary Cardiologist: Dr Selena Batten Primary EP: Dr Rayann Heman, provider location: [place] Patient location: home  Nicole Hampton is a 74 y.o. female who presents today for routine electrophysiology followup.  This is a video telehelth visit.  See mychart for consent.  Since last being seen in our clinic, the patient reports doing very well.  No symptomatic arrhythmias.  Today, she denies symptoms of palpitations, chest pain, shortness of breath,  lower extremity edema, dizziness, presyncope, syncope, or ICD shocks.  The patient is otherwise without complaint today.   Past Medical History:  Diagnosis Date  . BCC (basal cell carcinoma)    SEES DERM.  [name] Complication of anesthesia    extremem nausea and vomiting  . Hyperlipidemia    WITH ELEVATED LDL/HDL CLEAN CORS 2006. DR. Norville Haggard  . Hypertension   . Hypokalemia    CHRONIC  . Hypothyroidism   . Lactose intolerance   . OA (osteoarthritis)    HANDS AND FEET  . Pancreatitis 1982   GB  . Persistent atrial fibrillation (HCC)    also typical atrial flutter; she reports prior ablations with Dr Rosita Fire, no records available  . Rheumatic fever    as a child  . S/P cholecystectomy    OPEN  . Sudden cardiac death ([place]) [place]. AICD  . Vertigo    RECURRENT   Past Surgical History:  Procedure Laterality Date  . CARDIOVERSION N/A 02/11/2019   Procedure: CARDIOVERSION;  Surgeon: Elouise Munroe, MD;  Location: [facility] ENDOSCOPY;  Service: Cardiovascular;  Laterality: N/A;  . CESAREAN SECTION     x1  . ICD GENERATOR CHANGE  05/25/2014   MDT Gwyneth Revels S DR ICD implanted by Dr Rosita Fire (secondary prevention)  . 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 OPEN HEART SURGERY WITH DEFIB  2006   1996    ROS- all systems are reviewed and negative except as per HPI above  Current Outpatient Medications  Medication Sig  Dispense Refill  . aMILoride (MIDAMOR) 5 MG tablet Take 5 mg by mouth daily.    [name] amoxicillin (AMOXIL) 500 MG capsule Take 2,000 mg by mouth See admin instructions. Take 2000 mg 1 hour prior to dental work    . Ascorbic Acid (VITAMIN C PO) Take 1 tablet by mouth daily.    [name] azelastine (ASTELIN) 0.1 % nasal spray Place 1 spray into both nostrils 2 (two) times daily as needed for rhinitis. Use in each nostril as directed    . Calcium Carbonate-Vitamin D (CALTRATE 600+D PO) Take 3 tablets by mouth daily.     [name] ELIQUIS 5 MG TABS tablet TAKE ONE TABLET TWICE DAILY 60 tablet 6  . estradiol (ESTRACE) 2 MG tablet Take 1 mg by mouth at bedtime.    [name] ezetimibe (ZETIA) 10 MG tablet Take 10 mg by mouth daily.    . fexofenadine (ALLEGRA) 180 MG tablet Take 180 mg by mouth daily as needed for allergies or rhinitis.    . fluticasone (FLONASE) 50 MCG/ACT nasal spray Place 2 sprays into both nostrils daily as needed for allergies.     [name] levothyroxine (SYNTHROID) 25 MCG tablet Take 25 mcg by mouth daily before breakfast.    . Magnesium Cl-Calcium Carbonate (SLOW-MAG PO) Take 1 tablet by mouth daily.    . meclizine (ANTIVERT) 12.5 MG tablet Take 12.5 mg by mouth 3 (three) times  daily as needed for dizziness.    . Multiple Vitamin (MULTIVITAMIN) tablet Take 1 tablet by mouth once a week.     . omega-3 acid ethyl esters (LOVAZA) 1 g capsule Take 1 g by mouth daily.     . pindolol (VISKEN) 5 MG tablet Take 1.5 tablets (7.5 mg total) by mouth daily. 135 tablet 3  . potassium chloride (MICRO-K) 10 MEQ CR capsule Take 30-40 mEq by mouth See admin instructions. Take 40 meq in the morning and 30 meq at night    . temazepam (RESTORIL) 7.5 MG capsule Take 7.5 mg by mouth at bedtime as needed for sleep.      No current facility-administered medications for this visit.    Physical Exam: There were no vitals filed for this visit.  GEN- The patient is well appearing, alert and oriented x 3 today.   Head- normocephalic,  atraumatic Eyes-  Sclera clear, conjunctiva pink Ears- hearing intact Oropharynx- clear Lungs- normal work of breathing  ICD remotes are up to date    Wt Readings from Last 3 Encounters:  11/20/19 118 lb 9.6 oz (53.8 kg)  09/01/19 122 lb (55.3 kg)  08/26/19 124 lb 6.4 oz (56.4 kg)    Assessment and Plan:  1.  VF Normal ICD function See Claudia Desanctis Art report No changes today she is not device dependant today chronic RV lead threshold elevation is noted.  She does not RV pace  2.persistent afib/ /atypical atrial flutter Doing well  afib burden is <0.1% with 100 % of episodes terminated with atrial therapy delivered by the device Continue eliquis for chads2vasc score of 3  3. S/p MV repair Stable No change required today  Follow-up in AF clinic in 6 months I will see in a year  Risks, benefits and potential toxicities for medications prescribed and/or refilled reviewed with patient today.   Thompson Grayer MD, [facility] 03/22/2020 9:58 AM

## 2020-04-05 ENCOUNTER — Ambulatory Visit (INDEPENDENT_AMBULATORY_CARE_PROVIDER_SITE_OTHER): Payer: Medicare Other | Admitting: *Deleted

## 2020-04-05 DIAGNOSIS — I4901 Ventricular fibrillation: Secondary | ICD-10-CM

## 2020-04-06 LAB — CUP PACEART REMOTE DEVICE CHECK
Battery Remaining Longevity: 18 mo
Battery Voltage: 2.91 V
Brady Statistic AP VP Percent: 0.27 %
Brady Statistic AP VS Percent: 98.49 %
Brady Statistic AS VP Percent: 0 %
Brady Statistic AS VS Percent: 1.23 %
Brady Statistic RA Percent Paced: 95.81 %
Brady Statistic RV Percent Paced: 0.35 %
Date Time Interrogation Session: 20210913022824
HighPow Impedance: 41 Ohm
HighPow Impedance: 55 Ohm
Implantable Lead Implant Date: 20030319
Implantable Lead Implant Date: 20141103
Implantable Lead Location: 753859
Implantable Lead Location: 753860
Implantable Lead Model: 5076
Implantable Lead Model: 6947
Implantable Pulse Generator Implant Date: 20171103
Lead Channel Impedance Value: 475 Ohm
Lead Channel Impedance Value: 722 Ohm
Lead Channel Impedance Value: 779 Ohm
Lead Channel Pacing Threshold Amplitude: 0.75 V
Lead Channel Pacing Threshold Amplitude: 2.5 V
Lead Channel Pacing Threshold Pulse Width: 0.4 ms
Lead Channel Pacing Threshold Pulse Width: 0.4 ms
Lead Channel Sensing Intrinsic Amplitude: 0.375 mV
Lead Channel Sensing Intrinsic Amplitude: 0.375 mV
Lead Channel Sensing Intrinsic Amplitude: 3.125 mV
Lead Channel Sensing Intrinsic Amplitude: 3.125 mV
Lead Channel Setting Pacing Amplitude: 2 V
Lead Channel Setting Pacing Amplitude: 4.5 V
Lead Channel Setting Pacing Pulse Width: 1 ms
Lead Channel Setting Sensing Sensitivity: 0.45 mV

## 2020-04-07 NOTE — Progress Notes (Signed)
Remote ICD transmission.   

## 2020-05-10 ENCOUNTER — Other Ambulatory Visit: Payer: Self-pay | Admitting: Internal Medicine

## 2020-05-10 DIAGNOSIS — Z1231 Encounter for screening mammogram for malignant neoplasm of breast: Secondary | ICD-10-CM

## 2020-07-05 ENCOUNTER — Ambulatory Visit (INDEPENDENT_AMBULATORY_CARE_PROVIDER_SITE_OTHER): Payer: Medicare Other

## 2020-07-05 DIAGNOSIS — I4901 Ventricular fibrillation: Secondary | ICD-10-CM

## 2020-07-06 LAB — CUP PACEART REMOTE DEVICE CHECK
Battery Remaining Longevity: 15 mo
Battery Voltage: 2.89 V
Brady Statistic AP VP Percent: 0.69 %
Brady Statistic AP VS Percent: 98.46 %
Brady Statistic AS VP Percent: 0.07 %
Brady Statistic AS VS Percent: 0.78 %
Brady Statistic RA Percent Paced: 95.98 %
Brady Statistic RV Percent Paced: 0.89 %
Date Time Interrogation Session: 20211213012504
HighPow Impedance: 38 Ohm
HighPow Impedance: 51 Ohm
Implantable Lead Implant Date: 20030319
Implantable Lead Implant Date: 20141103
Implantable Lead Location: 753859
Implantable Lead Location: 753860
Implantable Lead Model: 5076
Implantable Lead Model: 6947
Implantable Pulse Generator Implant Date: 20171103
Lead Channel Impedance Value: 418 Ohm
Lead Channel Impedance Value: 665 Ohm
Lead Channel Impedance Value: 722 Ohm
Lead Channel Pacing Threshold Amplitude: 0.75 V
Lead Channel Pacing Threshold Amplitude: 2.5 V
Lead Channel Pacing Threshold Pulse Width: 0.4 ms
Lead Channel Pacing Threshold Pulse Width: 0.4 ms
Lead Channel Sensing Intrinsic Amplitude: 2 mV
Lead Channel Sensing Intrinsic Amplitude: 2 mV
Lead Channel Sensing Intrinsic Amplitude: 3 mV
Lead Channel Sensing Intrinsic Amplitude: 3 mV
Lead Channel Setting Pacing Amplitude: 2 V
Lead Channel Setting Pacing Amplitude: 4.5 V
Lead Channel Setting Pacing Pulse Width: 1 ms
Lead Channel Setting Sensing Sensitivity: 0.45 mV

## 2020-07-20 NOTE — Progress Notes (Signed)
Remote ICD transmission.   

## 2020-07-24 HISTORY — PX: CARDIAC DEFIBRILLATOR PLACEMENT: SHX171

## 2020-08-16 ENCOUNTER — Telehealth (HOSPITAL_COMMUNITY): Payer: Self-pay | Admitting: *Deleted

## 2020-08-16 ENCOUNTER — Telehealth: Payer: Self-pay | Admitting: Internal Medicine

## 2020-08-16 ENCOUNTER — Other Ambulatory Visit: Payer: Self-pay

## 2020-08-16 ENCOUNTER — Encounter (HOSPITAL_BASED_OUTPATIENT_CLINIC_OR_DEPARTMENT_OTHER): Payer: Self-pay | Admitting: *Deleted

## 2020-08-16 ENCOUNTER — Emergency Department (HOSPITAL_BASED_OUTPATIENT_CLINIC_OR_DEPARTMENT_OTHER)
Admission: EM | Admit: 2020-08-16 | Discharge: 2020-08-16 | Disposition: A | Discharge status: Home / Self Care | Payer: Medicare Other | Attending: Emergency Medicine | Admitting: Emergency Medicine

## 2020-08-16 DIAGNOSIS — Z5321 Procedure and treatment not carried out due to patient leaving prior to being seen by health care provider: Secondary | ICD-10-CM | POA: Diagnosis not present

## 2020-08-16 DIAGNOSIS — J019 Acute sinusitis, unspecified: Secondary | ICD-10-CM | POA: Insufficient documentation

## 2020-08-16 DIAGNOSIS — I4891 Unspecified atrial fibrillation: Secondary | ICD-10-CM | POA: Insufficient documentation

## 2020-08-16 NOTE — Telephone Encounter (Signed)
°  1. Has your device fired? no  2. Is you device beeping? no  3. Are you experiencing draining or swelling at device site? no  4. Are you calling to see if we received your device transmission? yes  5. Have you passed out? no  Patient thinks she is in afib and sent a transmission to make sure   Please route to Nicole Hampton

## 2020-08-16 NOTE — ED Triage Notes (Signed)
Hx of atrial fib and internal defibrillator. Here for possible sinus infection. R/o Covid.

## 2020-08-16 NOTE — Telephone Encounter (Signed)
Called patient to assist with transmission. States her monitor is showing code "[id number]". Advised patient to call [employer] for assistance. Phone number provided. Advised patient to call back after she has an update. Agreeable to plan.

## 2020-08-16 NOTE — Telephone Encounter (Signed)
Patient is in normal rhythm. Presenting AP/VS 76 bpm. Patient states she has experienced the shortness of breath that she has when she is in AF. States she contacted her PCP and he wants her to get tested for covid. States she will do that and advised patient to follow up with [place]. Verbalized understanding.

## 2020-08-16 NOTE — Telephone Encounter (Signed)
Patient called in stating she wonders if she is in Af since end of last week. She is experiencing shortness of breath, fatigue and increased heart rates. She also wonders if she has covid - ear pain, sinus congestion no fever going to call her PCP for assessment of this. Will let pt know results of transmission.

## 2020-08-16 NOTE — ED Notes (Signed)
Pt states she is leaving  

## 2020-08-17 ENCOUNTER — Telehealth: Payer: Self-pay

## 2020-08-17 NOTE — Telephone Encounter (Signed)
Transmission received 08/16/20. Patient is in regular rhythm. No episodes logged. Patient states she went to the ED last night and waited 4 hours then left. States she has apt. Today with PCP at 10:30. Advised if she has further questions to please call the device clinic. Verbalized understanding.

## 2020-09-07 NOTE — Progress Notes (Signed)
Primary Care Physician: Shon Baton, MD Primary Cardiologist: Dr Selena Batten  Primary Electrophysiologist: Dr Rayann Heman Referring Physician: [facility]   Nicole Hampton is a 75 y.o. female with a history of VT and VF arrest s/p ICD, paroxysmal afib, HLD, HTN, CAD s/p CABG, and MVR. Patient was noted to be in persistent AF since 01/03/19. She reports that she has been unaware of her arrhythmia with minimal fatigue. She had recently started a supplement called Preservision for eye health but that is the only change recently. She is no longer taking it. No other triggers. She denies snoring or alcohol use. Patient is s/p DCCV on 02/11/19. She is on Eliquis for a CHADS2VASC score of 4. Atrial therapies were turned on her device which has greatly reduced her afib burden. She did have some heart racing after a dental procedure but no afib. She denies any bleeding issues on anticoagulation.   Today, she denies symptoms of palpitations, chest pain, orthopnea, PND, lower extremity edema,  presyncope, syncope, snoring, daytime somnolence, bleeding, or neurologic sequela. The patient is tolerating medications without difficulties and is otherwise without complaint today.    Atrial Fibrillation Risk Factors:  she does not have symptoms or diagnosis of sleep apnea. she does not have a history of alcohol use. The patient does not have a history of early familial atrial fibrillation or other arrhythmias.  she has a BMI of Body mass index is 22.42 kg/m.[name] Filed Weights   09/08/20 0848  Weight: 55.6 kg    Family History  Problem Relation Age of Onset  . Arthritis Mother 23       DROWNING WITH HURRICANE [name]  . Hypertension Father 58       DROWNING WITH HURRICANE [name]  . ALS Sister   . Pancreatic cancer Brother   . Macular degeneration Brother      Atrial Fibrillation Management history:  Previous antiarrhythmic drugs: none Previous cardioversions: 02/11/19 Previous ablations:  none CHADS2VASC score: 4 Anticoagulation history: Eliquis   Past Medical History:  Diagnosis Date  . BCC (basal cell carcinoma)    SEES DERM.  [name] Complication of anesthesia    extremem nausea and vomiting  . Hyperlipidemia    WITH ELEVATED LDL/HDL CLEAN CORS 2006. DR. Norville Haggard  . Hypertension   . Hypokalemia    CHRONIC  . Hypothyroidism   . Lactose intolerance   . OA (osteoarthritis)    HANDS AND FEET  . Pancreatitis 1982   GB  . Persistent atrial fibrillation (HCC)    also typical atrial flutter; she reports prior ablations with Dr Rosita Fire, no records available  . Rheumatic fever    as a child  . S/P cholecystectomy    OPEN  . Sudden cardiac death ([place]) [place]. AICD  . Vertigo    RECURRENT   Past Surgical History:  Procedure Laterality Date  . CARDIOVERSION N/A 02/11/2019   Procedure: CARDIOVERSION;  Surgeon: Elouise Munroe, MD;  Location[facility] ENDOSCOPY;  Service: Cardiovascular;  Laterality: N/A;  . CESAREAN SECTION     x1  . ICD GENERATOR CHANGE  05/25/2014   MDT Gwyneth Revels S DR ICD implanted by Dr Rosita Fire (secondary prevention)  . 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 OPEN HEART SURGERY WITH DEFIB  2006   1996    Current Outpatient Medications  Medication Sig Dispense Refill  . aMILoride (MIDAMOR) 5 MG  tablet Take 5 mg by mouth daily.    [name] amoxicillin (AMOXIL) 500 MG capsule Take 2,000 mg by mouth See admin instructions. Take 2000 mg 1 hour prior to dental work    . Ascorbic Acid (VITAMIN C PO) Take 1 tablet by mouth daily.    . Calcium Carbonate-Vitamin D (CALTRATE 600+D PO) Take 3 tablets by mouth daily.     [name] ELIQUIS 5 MG TABS tablet TAKE ONE TABLET TWICE DAILY 60 tablet 6  . estradiol (ESTRACE) 2 MG tablet Take 1 mg by mouth at bedtime.    [name] ezetimibe (ZETIA) 10 MG tablet Take 10 mg by mouth daily.    . fexofenadine (ALLEGRA) 180 MG tablet Take 180  mg by mouth daily as needed for allergies or rhinitis.    . fluticasone (FLONASE) 50 MCG/ACT nasal spray Place 2 sprays into both nostrils daily as needed for allergies.     [name] levothyroxine (SYNTHROID) 25 MCG tablet Take 25 mcg by mouth daily before breakfast.    . Magnesium Cl-Calcium Carbonate (SLOW-MAG PO) Take 1 tablet by mouth daily.    . Multiple Vitamin (MULTIVITAMIN) tablet Take 1 tablet by mouth once a week.     . omega-3 acid ethyl esters (LOVAZA) 1 g capsule Take 1 g by mouth daily.     . pindolol (VISKEN) 5 MG tablet Take 1.5 tablets (7.5 mg total) by mouth daily. 135 tablet 3  . potassium chloride (MICRO-K) 10 MEQ CR capsule Take 40 mEq by mouth See admin instructions. Take 40 meq  twice a day    . temazepam (RESTORIL) 7.5 MG capsule Take 7.5 mg by mouth at bedtime as needed for sleep.     [name] azelastine (ASTELIN) 0.1 % nasal spray Place 1 spray into both nostrils 2 (two) times daily as needed for rhinitis. Use in each nostril as directed    . meclizine (ANTIVERT) 12.5 MG tablet Take 12.5 mg by mouth 3 (three) times daily as needed for dizziness.     No current facility-administered medications for this encounter.    Allergies  Allergen Reactions  . Cefuroxime Axetil Palpitations  . Codeine Nausea And Vomiting and Other (See Comments)    Gi Upset   . Morphine Palpitations and Rash  . Quinidine Gluconate Other (See Comments)    Positive ANA, tachycardia  . Other Nausea And Vomiting    general anesthesia - severe nausea and vomiting   . Latex Rash    Social History   Socioeconomic History  . Marital status: Married    Spouse name: [place]  . Number of children: 2  . Years of education: Not on file  . Highest education level: Not on file  Occupational History  . Occupation: [employer]  Tobacco Use  . Smoking status: Former Smoker    Quit date: 06/23/1975    Years since quitting: 45.2  . Smokeless tobacco: Never Used  Vaping Use  . Vaping Use: Never used   Substance and Sexual Activity  . Alcohol use: Not Currently  . Drug use: No  . Sexual activity: Not on file  Other Topics Concern  . Not on file  Social History Narrative  . Not on file   Social Determinants of Health   Financial Resource Strain: Not on file  Food Insecurity: Not on file  Transportation Needs: Not on file  Physical Activity: Not on file  Stress: Not on file  Social Connections: Not on file  Intimate Partner Violence: Not on file  ROS- All systems are reviewed and negative except as per the HPI above.  Physical Exam: Vitals:   09/08/20 0848  BP: (!) 102/58  Pulse: 78  Weight: 55.6 kg  Height: 5\' 2"  (1.575 m)    GEN- The patient is well appearing, alert and oriented x 3 today.   HEENT-head normocephalic, atraumatic, sclera clear, conjunctiva pink, hearing intact, trachea midline. Lungs- Clear to ausculation bilaterally, normal work of breathing Heart- Regular rate and rhythm, no murmurs, rubs or gallops  GI- soft, NT, ND, + BS Extremities- no clubbing, cyanosis, or edema MS- no significant deformity or atrophy Skin- no rash or lesion Psych- euthymic mood, full affect Neuro- strength and sensation are intact   Wt Readings from Last 3 Encounters:  09/08/20 55.6 kg  08/16/20 54.5 kg  11/20/19 53.8 kg    EKG today demonstrates  A paced rhythm, PVC  Vent. rate 78 BPM QRS duration 86 ms QT/QTc 398/453 ms  Echo 12/09/18 demonstrated  1. The left ventricle has normal systolic function with an ejection fraction of 60-65%. The cavity size was normal. Left ventricular diastolic Doppler parameters are indeterminate.  2. The right ventricle has normal systolic function. The cavity was normal.  3. Left atrial size was mildly dilated.  4. Mild thickening of the mitral valve leaflet. Mild mitral valve stenosis.  5. The tricuspid valve is grossly normal.  6. The aortic valve is tricuspid. Mild thickening of the aortic valve.  7. Akinesis of the basal  inferior wall with overall normal LV function; s/p MV repair with mean gradient 11 mmHg; MVA 1.8 cm2 by pressure halftime; likely mild MS; mild LAE; mild TR with mild pulmonary hypertension.  Epic records are reviewed at length today  Assessment and Plan:  1. Persistent atrial fibrillation/atrial flutter Patient maintaining SR with atrial therapies Continue Eliquis 5 mg BID Continue pindolol 5 mg daily   This patients CHA2DS2-VASc Score and unadjusted Ischemic Stroke Rate (% per year) is equal to 4.8 % stroke rate/year from a score of 4  Above score calculated as 1 point each if present [CHF, HTN, DM, Vascular=MI/PAD/Aortic Plaque, Age if 65-74, or Female] Above score calculated as 2 points each if present [Age > 75, or Stroke/TIA/TE]  2. HTN Stable, no changes today.  3. CAD S/p CABG 2006.  No anginal symptoms.  4. VF arrest/NSVT S/p ICD, followed by Dr Rayann Heman and the device clinic.   Follow up with Dr Rayann Heman in 6 months. AF clinic in one year.    [facility] [address] [place], [place] [address] [phone]

## 2020-09-08 ENCOUNTER — Encounter (HOSPITAL_COMMUNITY): Payer: Self-pay | Admitting: Physician Assistant

## 2020-09-08 ENCOUNTER — Other Ambulatory Visit: Payer: Self-pay

## 2020-09-08 ENCOUNTER — Ambulatory Visit (HOSPITAL_COMMUNITY)
Admission: RE | Admit: 2020-09-08 | Discharge: 2020-09-08 | Disposition: A | Discharge status: Home / Self Care | Payer: Medicare Other | Source: Ambulatory Visit | Attending: Physician Assistant | Admitting: Physician Assistant

## 2020-09-08 VITALS — BP 102/58 | HR 78 | Ht 62.0 in | Wt 122.6 lb

## 2020-09-08 DIAGNOSIS — Z951 Presence of aortocoronary bypass graft: Secondary | ICD-10-CM | POA: Diagnosis not present

## 2020-09-08 DIAGNOSIS — E785 Hyperlipidemia, unspecified: Secondary | ICD-10-CM | POA: Diagnosis not present

## 2020-09-08 DIAGNOSIS — Z885 Allergy status to narcotic agent status: Secondary | ICD-10-CM | POA: Diagnosis not present

## 2020-09-08 DIAGNOSIS — Z91040 Latex allergy status: Secondary | ICD-10-CM | POA: Diagnosis not present

## 2020-09-08 DIAGNOSIS — I472 Ventricular tachycardia: Secondary | ICD-10-CM | POA: Insufficient documentation

## 2020-09-08 DIAGNOSIS — Z888 Allergy status to other drugs, medicaments and biological substances status: Secondary | ICD-10-CM | POA: Insufficient documentation

## 2020-09-08 DIAGNOSIS — Z79899 Other long term (current) drug therapy: Secondary | ICD-10-CM | POA: Insufficient documentation

## 2020-09-08 DIAGNOSIS — I2510 Atherosclerotic heart disease of native coronary artery without angina pectoris: Secondary | ICD-10-CM | POA: Insufficient documentation

## 2020-09-08 DIAGNOSIS — Z7901 Long term (current) use of anticoagulants: Secondary | ICD-10-CM | POA: Diagnosis not present

## 2020-09-08 DIAGNOSIS — Z79890 Hormone replacement therapy (postmenopausal): Secondary | ICD-10-CM | POA: Insufficient documentation

## 2020-09-08 DIAGNOSIS — D6869 Other thrombophilia: Secondary | ICD-10-CM

## 2020-09-08 DIAGNOSIS — Z8674 Personal history of sudden cardiac arrest: Secondary | ICD-10-CM | POA: Diagnosis not present

## 2020-09-08 DIAGNOSIS — I10 Essential (primary) hypertension: Secondary | ICD-10-CM | POA: Insufficient documentation

## 2020-09-08 DIAGNOSIS — Z95810 Presence of automatic (implantable) cardiac defibrillator: Secondary | ICD-10-CM | POA: Insufficient documentation

## 2020-09-08 DIAGNOSIS — I4819 Other persistent atrial fibrillation: Secondary | ICD-10-CM | POA: Diagnosis not present

## 2020-09-08 DIAGNOSIS — Z87891 Personal history of nicotine dependence: Secondary | ICD-10-CM | POA: Diagnosis not present

## 2020-09-08 MED ORDER — PINDOLOL 5 MG PO TABS: 5.0000 mg | ORAL_TABLET | Freq: Every day | ORAL | 3 refills | Status: DC

## 2020-09-29 ENCOUNTER — Other Ambulatory Visit (HOSPITAL_COMMUNITY): Payer: Self-pay | Admitting: Physician Assistant

## 2020-09-30 ENCOUNTER — Other Ambulatory Visit: Payer: Self-pay

## 2020-09-30 ENCOUNTER — Ambulatory Visit: Payer: Medicare Other | Admitting: Nurse Practitioner

## 2020-09-30 ENCOUNTER — Encounter: Payer: Self-pay | Admitting: Nurse Practitioner

## 2020-09-30 VITALS — BP 136/82 | HR 88 | Ht 62.0 in | Wt 120.4 lb

## 2020-09-30 DIAGNOSIS — I4901 Ventricular fibrillation: Secondary | ICD-10-CM | POA: Diagnosis not present

## 2020-09-30 DIAGNOSIS — Z9889 Other specified postprocedural states: Secondary | ICD-10-CM

## 2020-09-30 DIAGNOSIS — I4819 Other persistent atrial fibrillation: Secondary | ICD-10-CM

## 2020-09-30 DIAGNOSIS — R0602 Shortness of breath: Secondary | ICD-10-CM

## 2020-09-30 NOTE — Progress Notes (Signed)
Electrophysiology Office Note Date: 09/30/2020  ID:  Nicole Hampton, Nicole Hampton 05/23/1946, MRN [id number]  PCP: Shon Baton, MD Primary Cardiologist: [place] Electrophysiologist: [place]  CC: shortness of breath   Nicole Hampton is a 75 y.o. female seen today for Dr Rayann Heman.  She presents today for routine electrophysiology followup.  Since last being seen in our clinic, the patient reports doing reasonably well.  She has had increased shortness of breath recently. Underwent evaluation at [facility] showed bilateral pleural effusions and CHF. She was started on low dose Lasix which helped symptoms.  She denies chest pain, palpitations,  PND, orthopnea, nausea, vomiting, dizziness, syncope, edema, weight gain, or early satiety.  She has not had ICD shocks.    Past Medical History:  Diagnosis Date  . BCC (basal cell carcinoma)    SEES DERM.  [name] Complication of anesthesia    extremem nausea and vomiting  . Hyperlipidemia    WITH ELEVATED LDL/HDL CLEAN CORS 2006. DR. Norville Haggard  . Hypertension   . Hypokalemia    CHRONIC  . Hypothyroidism   . Lactose intolerance   . OA (osteoarthritis)    HANDS AND FEET  . Pancreatitis 1982   GB  . Persistent atrial fibrillation (HCC)    also typical atrial flutter; she reports prior ablations with Dr Rosita Fire, no records available  . Rheumatic fever    as a child  . S/P cholecystectomy    OPEN  . Sudden cardiac death ([place]) [place]. AICD  . Vertigo    RECURRENT   Past Surgical History:  Procedure Laterality Date  . CARDIOVERSION N/A 02/11/2019   Procedure: CARDIOVERSION;  Surgeon: Elouise Munroe, MD;  Location: [facility] ENDOSCOPY;  Service: Cardiovascular;  Laterality: N/A;  . CESAREAN SECTION     x1  . ICD GENERATOR CHANGE  05/25/2014   MDT Gwyneth Revels S DR ICD implanted by Dr Rosita Fire (secondary prevention)  . MITRAL VALVE REPAIR (MV)/CORONARY ARTERY BYPASS GRAFTING (CABG)  2006   NV ISSUES PROBABLY FROM  RHEUMATIC FEVER  . OPEN HEART SURGERY WITH DEFIB  2006   1996    Current Outpatient Medications  Medication Sig Dispense Refill  . aMILoride (MIDAMOR) 5 MG tablet Take 5 mg by mouth daily.    [name] amoxicillin (AMOXIL) 500 MG capsule Take 2,000 mg by mouth See admin instructions. Take 2000 mg 1 hour prior to dental work    . amoxicillin (AMOXIL) 875 MG tablet Take 875 mg by mouth 2 (two) times daily.    . Ascorbic Acid (VITAMIN C PO) Take 1 tablet by mouth daily.    [name] azelastine (ASTELIN) 0.1 % nasal spray Place 1 spray into both nostrils 2 (two) times daily as needed for rhinitis. Use in each nostril as directed    . Calcium Carbonate-Vitamin D (CALTRATE 600+D PO) Take 3 tablets by mouth daily.     [name] ELIQUIS 5 MG TABS tablet TAKE ONE TABLET TWICE DAILY 60 tablet 6  . estradiol (ESTRACE) 2 MG tablet Take 1 mg by mouth at bedtime.    [name] ezetimibe (ZETIA) 10 MG tablet Take 10 mg by mouth daily.    . fexofenadine (ALLEGRA) 180 MG tablet Take 180 mg by mouth daily as needed for allergies or rhinitis.    . fluticasone (FLONASE) 50 MCG/ACT nasal spray Place 2 sprays into both nostrils daily as needed for allergies.     . furosemide (LASIX) 20 MG tablet Take 10 mg  by mouth every morning.    [name] levothyroxine (SYNTHROID) 25 MCG tablet Take 25 mcg by mouth daily before breakfast.    . Magnesium Cl-Calcium Carbonate (SLOW-MAG PO) Take 1 tablet by mouth daily.    . meclizine (ANTIVERT) 12.5 MG tablet Take 12.5 mg by mouth 3 (three) times daily as needed for dizziness.    . Multiple Vitamin (MULTIVITAMIN) tablet Take 1 tablet by mouth once a week.     . omega-3 acid ethyl esters (LOVAZA) 1 g capsule Take 1 g by mouth daily.     . pindolol (VISKEN) 5 MG tablet TAKE ONE AND ONE-HALF TABLETS DAILY 135 tablet 3  . potassium chloride (MICRO-K) 10 MEQ CR capsule Take 40 mEq by mouth See admin instructions. Take 40 meq  twice a day    . temazepam (RESTORIL) 7.5 MG capsule Take 7.5 mg by mouth at bedtime as needed  for sleep.      No current facility-administered medications for this visit.    Allergies:   Cefuroxime axetil, Codeine, Morphine, Quinidine gluconate, Other, and Latex   Social History: Social History   Socioeconomic History  . Marital status: Married    Spouse name: [place]  . Number of children: 2  . Years of education: Not on file  . Highest education level: Not on file  Occupational History  . Occupation: [employer]  Tobacco Use  . Smoking status: Former Smoker    Quit date: 06/23/1975    Years since quitting: 45.3  . Smokeless tobacco: Never Used  Vaping Use  . Vaping Use: Never used  Substance and Sexual Activity  . Alcohol use: Not Currently  . Drug use: No  . Sexual activity: Not on file  Other Topics Concern  . Not on file  Social History Narrative  . Not on file   Social Determinants of Health   Financial Resource Strain: Not on file  Food Insecurity: Not on file  Transportation Needs: Not on file  Physical Activity: Not on file  Stress: Not on file  Social Connections: Not on file  Intimate Partner Violence: Not on file    Family History: Family History  Problem Relation Age of Onset  . Arthritis Mother 50       DROWNING WITH HURRICANE [name]  . Hypertension Father 59       DROWNING WITH HURRICANE [name]  . ALS Sister   . Pancreatic cancer Brother   . Macular degeneration Brother     Review of Systems: All other systems reviewed and are otherwise negative except as noted above.   Physical Exam: VS:  BP 136/82   Pulse 88   Ht 5\' 2"  (1.575 m)   Wt 120 lb 6.4 oz (54.6 kg)   SpO2 100%   BMI 22.02 kg/m  , BMI Body mass index is 22.02 kg/m.  GEN- The patient is elderly appearing, alert and oriented x 3 today.   HEENT: normocephalic, atraumatic; sclera clear, conjunctiva pink; hearing intact; oropharynx clear; neck supple  Lungs- Clear to ausculation bilaterally, normal work of breathing.  No wheezes, rales, rhonchi Heart- Regular  rate and rhythm  GI- soft, non-tender, non-distended  Extremities- no clubbing, cyanosis, or edema  MS- no significant deformity or atrophy Skin- warm and dry, no rash or lesion; ICD pocket well healed Psych- euthymic mood, full affect Neuro- strength and sensation are intact  ICD interrogation- reviewed in detail today,  See PACEART report  EKG:  EKG is not ordered today.  Recent Labs:  No results found for requested labs within last 8760 hours.   Wt Readings from Last 3 Encounters:  09/30/20 120 lb 6.4 oz (54.6 kg)  09/08/20 122 lb 9.6 oz (55.6 kg)  08/16/20 120 lb 3.2 oz (54.5 kg)     Other studies Reviewed: Additional studies/ records that were reviewed today include: [facility] notes   Assessment and Plan:  1.  Prior VF arrest  1 NSVT episode in VT monitor zone Estimated longevity 13 months  Normal ICD function See Pace Art report No changes today  2.  Paroxysmal atrial fibrillation Continue Eliquis for CHADS2VASC of 3 Burden by device interrogation <1% - atrial therapies on  3.  S/p MV repair Update echo  4.  Shortness of breath CXR at [facility] with bilateral effusions and CHF She is improved on low dose Lasix Will update labs today Update echo as well   Current medicines are reviewed at length with the patient today.   The patient does not have concerns regarding her medicines.  The following changes were made today:  none  Labs/ tests ordered today include:  Orders Placed This Encounter  Procedures  . Basic metabolic panel  . CBC  . Pro b natriuretic peptide (BNP)  . ECHOCARDIOGRAM COMPLETE     Disposition:   Follow up with Dr Rayann Heman as scheduled, [facility]      Signed, Chanetta Marshall, NP 09/30/2020 9:48 AM  [facility] [address] [place] [address] [phone] (office) [phone] (fax)

## 2020-09-30 NOTE — Patient Instructions (Signed)
Medication Instructions:  Your physician recommends that you continue on your current medications as directed. Please refer to the Current Medication list given to you today.  *If you need a refill on your cardiac medications before your next appointment, please call your pharmacy*   Lab Work: TODAY: BMET, CBC, BNP  If you have labs (blood work) drawn today and your tests are completely normal, you will receive your results only by: [name] MyChart Message (if you have MyChart) OR . A paper copy in the mail If you have any lab test that is abnormal or we need to change your treatment, we will call you to review the results.   Testing/Procedures: Your physician has requested that you have an echocardiogram. Echocardiography is a painless test that uses sound waves to create images of your heart. It provides your doctor with information about the size and shape of your heart and how well your heart's chambers and valves are working. This procedure takes approximately one hour. There are no restrictions for this procedure.   Follow-Up: At [facility], you and your health needs are our priority.  As part of our continuing mission to provide you with exceptional heart care, we have created designated Provider Care Teams.  These Care Teams include your primary Cardiologist (physician) and Advanced Practice Providers (APPs -  Physician Assistants and Nurse Practitioners) who all work together to provide you with the care you need, when you need it.  Your next appointment:   6 month(s)  The format for your next appointment:   In Person  Provider:   You may see Thompson Grayer, MD or one of the following Advanced Practice Providers on your designated Care Team:    Chanetta Marshall, NP  Tommye Standard, PA-C  Le[facility]o "[place]" [place], [place]

## 2020-10-01 LAB — CBC
Hematocrit: 40.5 % (ref 34.0–46.6)
Hemoglobin: 13.4 g/dL (ref 11.1–15.9)
MCH: 30.4 pg (ref 26.6–33.0)
MCHC: 33.1 g/dL (ref 31.5–35.7)
MCV: 92 fL (ref 79–97)
Platelets: 271 10*3/uL (ref 150–450)
RBC: 4.41 x10E6/uL (ref 3.77–5.28)
RDW: 12.5 % (ref 11.7–15.4)
WBC: 4.7 10*3/uL (ref 3.4–10.8)

## 2020-10-01 LAB — BASIC METABOLIC PANEL
BUN/Creatinine Ratio: 23 (ref 12–28)
BUN: 19 mg/dL (ref 8–27)
CO2: 20 mmol/L (ref 20–29)
Calcium: 9.2 mg/dL (ref 8.7–10.3)
Chloride: 103 mmol/L (ref 96–106)
Creatinine, Ser: 0.82 mg/dL (ref 0.57–1.00)
Glucose: 86 mg/dL (ref 65–99)
Potassium: 5 mmol/L (ref 3.5–5.2)
Sodium: 140 mmol/L (ref 134–144)
eGFR: 76 mL/min/{1.73_m2} (ref >59)

## 2020-10-01 LAB — PRO B NATRIURETIC PEPTIDE: NT-Pro BNP: 621 pg/mL — ABNORMAL HIGH (ref 0–301)

## 2020-10-04 ENCOUNTER — Ambulatory Visit (INDEPENDENT_AMBULATORY_CARE_PROVIDER_SITE_OTHER): Payer: Medicare Other

## 2020-10-04 DIAGNOSIS — I4901 Ventricular fibrillation: Secondary | ICD-10-CM | POA: Diagnosis not present

## 2020-10-06 LAB — CUP PACEART REMOTE DEVICE CHECK
Battery Remaining Longevity: 12 mo
Battery Voltage: 2.88 V
Brady Statistic AP VP Percent: 0.47 %
Brady Statistic AP VS Percent: 99.14 %
Brady Statistic AS VP Percent: 0.01 %
Brady Statistic AS VS Percent: 0.38 %
Brady Statistic RA Percent Paced: 96.53 %
Brady Statistic RV Percent Paced: 0.6 %
Date Time Interrogation Session: 20220314043626
HighPow Impedance: 38 Ohm
HighPow Impedance: 48 Ohm
Implantable Lead Implant Date: 20030319
Implantable Lead Implant Date: 20141103
Implantable Lead Location: 753859
Implantable Lead Location: 753860
Implantable Lead Model: 5076
Implantable Lead Model: 6947
Implantable Pulse Generator Implant Date: 20171103
Lead Channel Impedance Value: 418 Ohm
Lead Channel Impedance Value: 608 Ohm
Lead Channel Impedance Value: 665 Ohm
Lead Channel Pacing Threshold Amplitude: 0.75 V
Lead Channel Pacing Threshold Amplitude: 2.5 V
Lead Channel Pacing Threshold Pulse Width: 0.4 ms
Lead Channel Pacing Threshold Pulse Width: 0.4 ms
Lead Channel Sensing Intrinsic Amplitude: 1.625 mV
Lead Channel Sensing Intrinsic Amplitude: 1.625 mV
Lead Channel Sensing Intrinsic Amplitude: 3.875 mV
Lead Channel Sensing Intrinsic Amplitude: 3.875 mV
Lead Channel Setting Pacing Amplitude: 2 V
Lead Channel Setting Pacing Amplitude: 4.5 V
Lead Channel Setting Pacing Pulse Width: 1 ms
Lead Channel Setting Sensing Sensitivity: 0.45 mV

## 2020-10-12 NOTE — Progress Notes (Signed)
Remote ICD transmission.   

## 2020-10-25 ENCOUNTER — Ambulatory Visit (HOSPITAL_COMMUNITY): Payer: Medicare Other | Attending: Internal Medicine

## 2020-10-25 ENCOUNTER — Other Ambulatory Visit: Payer: Self-pay

## 2020-10-25 DIAGNOSIS — R0602 Shortness of breath: Secondary | ICD-10-CM | POA: Diagnosis present

## 2020-10-25 DIAGNOSIS — I4819 Other persistent atrial fibrillation: Secondary | ICD-10-CM | POA: Insufficient documentation

## 2020-10-25 LAB — ECHOCARDIOGRAM COMPLETE
Area-P 1/2: 1.97 cm2
MV VTI: 0.71 cm2
S' Lateral: 2.4 cm

## 2020-11-16 ENCOUNTER — Telehealth: Payer: Self-pay | Admitting: Student

## 2020-11-16 NOTE — Telephone Encounter (Signed)
    Pt is returning call to get echo results

## 2020-11-18 NOTE — Telephone Encounter (Signed)
Pt aware of results 

## 2020-12-22 ENCOUNTER — Telehealth: Payer: Self-pay

## 2020-12-22 NOTE — Telephone Encounter (Signed)
Notes on file.

## 2020-12-29 ENCOUNTER — Telehealth: Payer: Self-pay | Admitting: *Deleted

## 2020-12-29 ENCOUNTER — Other Ambulatory Visit: Payer: Self-pay

## 2020-12-29 ENCOUNTER — Telehealth (INDEPENDENT_AMBULATORY_CARE_PROVIDER_SITE_OTHER): Payer: Medicare Other | Admitting: Internal Medicine

## 2020-12-29 VITALS — HR 89 | Ht 62.0 in | Wt 116.0 lb

## 2020-12-29 DIAGNOSIS — I4819 Other persistent atrial fibrillation: Secondary | ICD-10-CM | POA: Diagnosis not present

## 2020-12-29 DIAGNOSIS — Z9889 Other specified postprocedural states: Secondary | ICD-10-CM | POA: Diagnosis not present

## 2020-12-29 DIAGNOSIS — I484 Atypical atrial flutter: Secondary | ICD-10-CM

## 2020-12-29 DIAGNOSIS — I4901 Ventricular fibrillation: Secondary | ICD-10-CM

## 2020-12-29 NOTE — Telephone Encounter (Signed)
Instructions for PFT and Myoview sent to the patient.

## 2020-12-29 NOTE — Progress Notes (Signed)
Electrophysiology TeleHealth Note   Due to national recommendations of social distancing due to COVID 19, an audio/video telehealth visit is felt to be most appropriate for this patient at this time.  See MyChart message from today for the patient's consent to telehealth for [facility].  Date:  12/29/2020   ID:  Nicole Hampton, DOB 02-Mar-1946, MRN [id number]  Location: patient's home  Provider location:  [place]  Evaluation Performed: Follow-up visit  PCP:  Shon Baton, MD   Electrophysiologist:  Dr Rayann Heman  Chief Complaint:  palpitations  History of Present Illness:    Nicole Hampton is a 75 y.o. female who presents via telehealth conferencing today.  Since last being seen in our clinic, the patient reports doing reasonably well. She continues to have SOB and fatigue.  She follows with Dr Virgina Jock.  The cause remains unclear.   Today, she denies symptoms of palpitations, chest pain,  lower extremity edema, dizziness, presyncope, or syncope.  The patient is otherwise without complaint today.   Past Medical History:  Diagnosis Date  . BCC (basal cell carcinoma)    SEES DERM.  [name] Complication of anesthesia    extremem nausea and vomiting  . Hyperlipidemia    WITH ELEVATED LDL/HDL CLEAN CORS 2006. DR. Norville Haggard  . Hypertension   . Hypokalemia    CHRONIC  . Hypothyroidism   . Lactose intolerance   . OA (osteoarthritis)    HANDS AND FEET  . Pancreatitis 1982   GB  . Persistent atrial fibrillation (HCC)    also typical atrial flutter; she reports prior ablations with Dr Rosita Fire, no records available  . Rheumatic fever    as a child  . S/P cholecystectomy    OPEN  . Sudden cardiac death ([place]) [place]. AICD  . Vertigo    RECURRENT    Past Surgical History:  Procedure Laterality Date  . CARDIOVERSION N/A 02/11/2019   Procedure: CARDIOVERSION;  Surgeon: Elouise Munroe, MD;  Location: [facility] ENDOSCOPY;  Service: Cardiovascular;   Laterality: N/A;  . CESAREAN SECTION     x1  . ICD GENERATOR CHANGE  05/25/2014   MDT Gwyneth Revels S DR ICD implanted by Dr Rosita Fire (secondary prevention)  . 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 OPEN HEART SURGERY WITH DEFIB  2006   1996    Current Outpatient Medications  Medication Sig Dispense Refill  . aMILoride (MIDAMOR) 5 MG tablet Take 5 mg by mouth daily.    [name] amoxicillin (AMOXIL) 500 MG capsule Take 2,000 mg by mouth See admin instructions. Take 2000 mg 1 hour prior to dental work    . amoxicillin (AMOXIL) 875 MG tablet Take 875 mg by mouth 2 (two) times daily.    . Ascorbic Acid (VITAMIN C PO) Take 1 tablet by mouth daily.    [name] azelastine (ASTELIN) 0.1 % nasal spray Place 1 spray into both nostrils 2 (two) times daily as needed for rhinitis. Use in each nostril as directed    . Calcium Carbonate-Vitamin D (CALTRATE 600+D PO) Take 3 tablets by mouth daily.     [name] ELIQUIS 5 MG TABS tablet TAKE ONE TABLET TWICE DAILY 60 tablet 6  . estradiol (ESTRACE) 2 MG tablet Take 1 mg by mouth at bedtime.    [name] ezetimibe (ZETIA) 10 MG tablet Take 10 mg by mouth daily.    . fexofenadine (  ALLEGRA) 180 MG tablet Take 180 mg by mouth daily as needed for allergies or rhinitis.    . fluticasone (FLONASE) 50 MCG/ACT nasal spray Place 2 sprays into both nostrils daily as needed for allergies.     . furosemide (LASIX) 20 MG tablet Take 10 mg by mouth every morning.    [name] levothyroxine (SYNTHROID) 25 MCG tablet Take 25 mcg by mouth daily before breakfast.    . Magnesium Cl-Calcium Carbonate (SLOW-MAG PO) Take 1 tablet by mouth daily.    . meclizine (ANTIVERT) 12.5 MG tablet Take 12.5 mg by mouth 3 (three) times daily as needed for dizziness.    . Multiple Vitamin (MULTIVITAMIN) tablet Take 1 tablet by mouth once a week.     . omega-3 acid ethyl esters (LOVAZA) 1 g capsule Take 1 g by mouth daily.     . pindolol (VISKEN) 5 MG tablet  TAKE ONE AND ONE-HALF TABLETS DAILY 135 tablet 3  . potassium chloride (MICRO-K) 10 MEQ CR capsule Take 40 mEq by mouth See admin instructions. Take 40 meq  twice a day    . temazepam (RESTORIL) 7.5 MG capsule Take 7.5 mg by mouth at bedtime as needed for sleep.      No current facility-administered medications for this visit.    Allergies:   Cefuroxime axetil, Codeine, Morphine, Quinidine gluconate, Other, and Latex   Social History:  The patient  reports that she quit smoking about 45 years ago. She has never used smokeless tobacco. She reports previous alcohol use. She reports that she does not use drugs.   ROS:  Please see the history of present illness.   All other systems are personally reviewed and negative.    Exam:    Vital Signs:  Pulse 89   Ht 5\' 2"  (1.575 m)   Wt 116 lb (52.6 kg)   BMI 21.22 kg/m   Well sounding and appearing, alert and conversant, regular work of breathing,  good skin color Eyes- anicteric, neuro- grossly intact, skin- no apparent rash or lesions or cyanosis, mouth- oral mucosa is pink  Labs/Other Tests and Data Reviewed:    Recent Labs: 09/30/2020: BUN 19; Creatinine, Ser 0.82; Hemoglobin 13.4; NT-Pro BNP 621; Platelets 271; Potassium 5.0; Sodium 140   Wt Readings from Last 3 Encounters:  12/29/20 116 lb (52.6 kg)  09/30/20 120 lb 6.4 oz (54.6 kg)  09/08/20 122 lb 9.6 oz (55.6 kg)    Echo 10/25/20- EF 60%, s/p MV repair   ASSESSMENT & PLAN:    1.  SOB/ fatigue Unclear etiology Will order lexiscan myoview Risks of procedure discussed with her today Also order PFTs with spirometry Echo reassuring  2. Paroxysmal atrial fibrillation Currently well controlled by ICD evaluation  3. Prior VF arrest Results from remote personally reviewed Normal ICD function Continue remote monitoring  4. S/p CABG/ MVR myoview as above Echo reviewed   Follow-up:  Richardson Dopp after myoview/ PFT is resulted   Patient Risk:  after full review of this  patients clinical status, I feel that they are at moderate risk at this time.  Today, I have spent 15 minutes with the patient with telehealth technology discussing arrhythmia management .    Army Fossa, MD  12/29/2020 4:38 PM     [address] [address] [phone] (office) [phone] (fax)

## 2021-01-03 ENCOUNTER — Ambulatory Visit (INDEPENDENT_AMBULATORY_CARE_PROVIDER_SITE_OTHER): Payer: Medicare Other

## 2021-01-03 DIAGNOSIS — I4901 Ventricular fibrillation: Secondary | ICD-10-CM | POA: Diagnosis not present

## 2021-01-06 LAB — CUP PACEART REMOTE DEVICE CHECK
Battery Remaining Longevity: 10 mo
Battery Voltage: 2.86 V
Brady Statistic AP VP Percent: 0.33 %
Brady Statistic AP VS Percent: 98.33 %
Brady Statistic AS VP Percent: 0.01 %
Brady Statistic AS VS Percent: 1.33 %
Brady Statistic RA Percent Paced: 96.35 %
Brady Statistic RV Percent Paced: 0.42 %
Date Time Interrogation Session: 20220613012204
HighPow Impedance: 41 Ohm
HighPow Impedance: 56 Ohm
Implantable Lead Implant Date: 20030319
Implantable Lead Implant Date: 20141103
Implantable Lead Location: 753859
Implantable Lead Location: 753860
Implantable Lead Model: 5076
Implantable Lead Model: 6947
Implantable Pulse Generator Implant Date: 20171103
Lead Channel Impedance Value: 456 Ohm
Lead Channel Impedance Value: 760 Ohm
Lead Channel Impedance Value: 817 Ohm
Lead Channel Pacing Threshold Amplitude: 0.75 V
Lead Channel Pacing Threshold Amplitude: 2.5 V
Lead Channel Pacing Threshold Pulse Width: 0.4 ms
Lead Channel Pacing Threshold Pulse Width: 0.4 ms
Lead Channel Sensing Intrinsic Amplitude: 0.25 mV
Lead Channel Sensing Intrinsic Amplitude: 0.25 mV
Lead Channel Sensing Intrinsic Amplitude: 2.875 mV
Lead Channel Sensing Intrinsic Amplitude: 2.875 mV
Lead Channel Setting Pacing Amplitude: 2 V
Lead Channel Setting Pacing Amplitude: 4.5 V
Lead Channel Setting Pacing Pulse Width: 1 ms
Lead Channel Setting Sensing Sensitivity: 0.45 mV

## 2021-01-11 ENCOUNTER — Telehealth (HOSPITAL_COMMUNITY): Payer: Self-pay | Admitting: *Deleted

## 2021-01-11 NOTE — Telephone Encounter (Signed)
Left message on voicemail per DPR in reference to upcoming appointment scheduled on 01/17/21 at 7:45 with detailed instructions given per Myocardial Perfusion Study Information Sheet for the test. LM to arrive 15 minutes early, and that it is imperative to arrive on time for appointment to keep from having the test rescheduled. If you need to cancel or reschedule your appointment, please call the office within 24 hours of your appointment. Failure to do so may result in a cancellation of your appointment, and a $50 no show fee. Phone number given for call back for any questions.

## 2021-01-17 ENCOUNTER — Other Ambulatory Visit: Payer: Self-pay

## 2021-01-17 ENCOUNTER — Other Ambulatory Visit (HOSPITAL_COMMUNITY)
Admission: RE | Admit: 2021-01-17 | Discharge: 2021-01-17 | Disposition: A | Discharge status: Home / Self Care | Payer: Medicare Other | Source: Ambulatory Visit | Attending: Internal Medicine | Admitting: Internal Medicine

## 2021-01-17 ENCOUNTER — Ambulatory Visit (HOSPITAL_BASED_OUTPATIENT_CLINIC_OR_DEPARTMENT_OTHER): Payer: Medicare Other

## 2021-01-17 DIAGNOSIS — I4901 Ventricular fibrillation: Secondary | ICD-10-CM

## 2021-01-17 DIAGNOSIS — I4819 Other persistent atrial fibrillation: Secondary | ICD-10-CM | POA: Insufficient documentation

## 2021-01-17 DIAGNOSIS — I484 Atypical atrial flutter: Secondary | ICD-10-CM

## 2021-01-17 DIAGNOSIS — Z9889 Other specified postprocedural states: Secondary | ICD-10-CM

## 2021-01-17 DIAGNOSIS — Z01818 Encounter for other preprocedural examination: Secondary | ICD-10-CM | POA: Diagnosis not present

## 2021-01-17 DIAGNOSIS — Z20822 Contact with and (suspected) exposure to covid-19: Secondary | ICD-10-CM | POA: Insufficient documentation

## 2021-01-17 LAB — MYOCARDIAL PERFUSION IMAGING
LV dias vol: 65 mL (ref 46–106)
LV sys vol: 29 mL
Peak HR: 72 {beats}/min
Rest HR: 65 {beats}/min
SDS: 3
SRS: 4
SSS: 7
TID: 0.98

## 2021-01-17 LAB — SARS CORONAVIRUS 2 (TAT 6-24 HRS): SARS Coronavirus 2: NEGATIVE

## 2021-01-17 MED ORDER — REGADENOSON 0.4 MG/5ML IV SOLN
0.4000 mg | Freq: Once | INTRAVENOUS | Status: AC
  Administered 2021-01-17: 0.4 mg via INTRAVENOUS

## 2021-01-17 MED ORDER — TECHNETIUM TC 99M TETROFOSMIN IV KIT
27.5000 | PACK | Freq: Once | INTRAVENOUS | Status: AC | PRN
Start: 2021-01-17 — End: 2021-01-17
  Administered 2021-01-17: 27.5 via INTRAVENOUS
  Filled 2021-01-17: qty 28

## 2021-01-17 MED ORDER — TECHNETIUM TC 99M TETROFOSMIN IV KIT
9.6000 | PACK | Freq: Once | INTRAVENOUS | Status: AC | PRN
  Administered 2021-01-17: 9.6 via INTRAVENOUS
  Filled 2021-01-17: qty 10

## 2021-01-19 ENCOUNTER — Ambulatory Visit (INDEPENDENT_AMBULATORY_CARE_PROVIDER_SITE_OTHER): Payer: Medicare Other | Admitting: Internal Medicine

## 2021-01-19 ENCOUNTER — Other Ambulatory Visit: Payer: Self-pay

## 2021-01-19 DIAGNOSIS — I4819 Other persistent atrial fibrillation: Secondary | ICD-10-CM

## 2021-01-19 DIAGNOSIS — I4901 Ventricular fibrillation: Secondary | ICD-10-CM | POA: Diagnosis not present

## 2021-01-19 DIAGNOSIS — I484 Atypical atrial flutter: Secondary | ICD-10-CM

## 2021-01-19 DIAGNOSIS — Z9889 Other specified postprocedural states: Secondary | ICD-10-CM

## 2021-01-19 LAB — PULMONARY FUNCTION TEST
DL/VA % pred: 109 %
DL/VA: 4.57 ml/min/mmHg/L
DLCO cor % pred: 78 %
DLCO cor: 14.19 ml/min/mmHg
DLCO unc % pred: 78 %
DLCO unc: 14.19 ml/min/mmHg
FEF 25-75 Post: 1.98 L/sec
FEF 25-75 Pre: 1.67 L/sec
FEF2575-%Change-Post: 18 %
FEF2575-%Pred-Post: 120 %
FEF2575-%Pred-Pre: 101 %
FEV1-%Change-Post: 2 %
FEV1-%Pred-Post: 89 %
FEV1-%Pred-Pre: 87 %
FEV1-Post: 1.77 L
FEV1-Pre: 1.73 L
FEV1FVC-%Change-Post: 1 %
FEV1FVC-%Pred-Pre: 110 %
FEV6-%Change-Post: 1 %
FEV6-%Pred-Post: 83 %
FEV6-%Pred-Pre: 82 %
FEV6-Post: 2.1 L
FEV6-Pre: 2.07 L
FEV6FVC-%Pred-Post: 105 %
FEV6FVC-%Pred-Pre: 105 %
FVC-%Change-Post: 1 %
FVC-%Pred-Post: 79 %
FVC-%Pred-Pre: 78 %
FVC-Post: 2.1 L
FVC-Pre: 2.07 L
Post FEV1/FVC ratio: 84 %
Post FEV6/FVC ratio: 100 %
Pre FEV1/FVC ratio: 83 %
Pre FEV6/FVC Ratio: 100 %
RV % pred: 64 %
RV: 1.38 L
TLC % pred: 74 %
TLC: 3.55 L

## 2021-01-19 NOTE — Progress Notes (Signed)
Full PFT performed today. °

## 2021-01-19 NOTE — Patient Instructions (Signed)
Full PFT performed today. °

## 2021-01-25 NOTE — Progress Notes (Signed)
Remote ICD transmission.   

## 2021-02-07 ENCOUNTER — Other Ambulatory Visit: Payer: Self-pay | Admitting: *Deleted

## 2021-02-07 ENCOUNTER — Other Ambulatory Visit: Payer: Self-pay

## 2021-02-07 ENCOUNTER — Ambulatory Visit: Payer: Medicare Other | Admitting: Physician Assistant

## 2021-02-07 ENCOUNTER — Encounter: Payer: Self-pay | Admitting: Physician Assistant

## 2021-02-07 VITALS — BP 120/72 | HR 74 | Ht 62.0 in | Wt 118.8 lb

## 2021-02-07 DIAGNOSIS — Z9889 Other specified postprocedural states: Secondary | ICD-10-CM

## 2021-02-07 DIAGNOSIS — R0602 Shortness of breath: Secondary | ICD-10-CM | POA: Diagnosis not present

## 2021-02-07 DIAGNOSIS — I340 Nonrheumatic mitral (valve) insufficiency: Secondary | ICD-10-CM

## 2021-02-07 DIAGNOSIS — I5032 Chronic diastolic (congestive) heart failure: Secondary | ICD-10-CM | POA: Diagnosis not present

## 2021-02-07 DIAGNOSIS — Z95810 Presence of automatic (implantable) cardiac defibrillator: Secondary | ICD-10-CM

## 2021-02-07 DIAGNOSIS — I4819 Other persistent atrial fibrillation: Secondary | ICD-10-CM | POA: Diagnosis not present

## 2021-02-07 DIAGNOSIS — I4901 Ventricular fibrillation: Secondary | ICD-10-CM

## 2021-02-07 MED ORDER — POTASSIUM CHLORIDE ER 10 MEQ PO CPCR: 40.0000 meq | ORAL_CAPSULE | Freq: Two times a day (BID) | ORAL | 11 refills | Status: AC

## 2021-02-07 MED ORDER — FUROSEMIDE 20 MG PO TABS: 10.0000 mg | ORAL_TABLET | ORAL | 11 refills | Status: DC

## 2021-02-07 MED ORDER — PINDOLOL 5 MG PO TABS: 5.0000 mg | ORAL_TABLET | Freq: Every day | ORAL | 11 refills | Status: DC

## 2021-02-07 MED ORDER — POTASSIUM CHLORIDE ER 10 MEQ PO CPCR: 20.0000 meq | ORAL_CAPSULE | Freq: Two times a day (BID) | ORAL | 11 refills | Status: DC

## 2021-02-07 NOTE — Patient Instructions (Signed)
Medication Instructions:   INCREASE Pindolol one tablet by mouth ( 5 mg) daily. DECREASE Lasix one tablet by mouth ( 10 mg) X 3 weekly.  INCREASE K-dur two tablet by mouth ( 40 meq) twice daily, take extra one tablet by mouth ( 10 meq) on days you take Lasix X 3 weekly.   *If you need a refill on your cardiac medications before your next appointment, please call your pharmacy*   Lab Work:  Patient was given lab orders today and will get them drawn at [facility] in [place] in 2 weeks.   If you have labs (blood work) drawn today and your tests are completely normal, you will receive your results only by: [place] (if you have MyChart) OR A paper copy in the mail If you have any lab test that is abnormal or we need to change your treatment, we will call you to review the results.   Testing/Procedures: Non-Cardiac CT scanning, (CAT scanning), is a noninvasive, special x-ray that produces cross-sectional images of the body using x-rays and a computer. CT scans help physicians diagnose and treat medical conditions. For some CT exams, a contrast material is used to enhance visibility in the area of the body being studied. CT scans provide greater clarity and reveal more details than regular x-ray exams. On Wednesday, August 17 @ 10:30 at [address] location.   Follow-Up: At [facility], you and your health needs are our priority.  As part of our continuing mission to provide you with exceptional heart care, we have created designated Provider Care Teams.  These Care Teams include your primary Cardiologist (physician) and Advanced Practice Providers (APPs -  Physician Assistants and Nurse Practitioners) who all work together to provide you with the care you need, when you need it.  We recommend signing up for the patient portal called "MyChart".  Sign up information is provided on this After Visit Summary.  MyChart is used to connect with patients for Virtual Visits  (Telemedicine).  Patients are able to view lab/test results, encounter notes, upcoming appointments, etc.  Non-urgent messages can be sent to your provider as well.   To learn more about what you can do with MyChart, go to [email or web address].    Your next appointment:   3 month(s) with Richardson Dopp, PA-C on Wednesday, October 5 at 8:45 am.   The format for your next appointment:   In Person  Provider:   Richardson Dopp, PA-C   Other Instructions You have been referred to Pulmonology.

## 2021-02-07 NOTE — Progress Notes (Addendum)
Cardiology Office Note:    Date:  02/07/2021   ID:  Nicole Hampton, DOB [birth year], MRN [id number]  PCP:  Shon Baton, MD   [facility] Providers Cardiologist:  Freada Bergeron, MD Cardiology APP:  Liliane Shi, PA-C    EP: Thompson Grayer, MD  Referring MD: Shon Baton, MD   Chief Complaint:  Follow-up (Shortness of breath)    Patient Profile:    Nicole Hampton is a 75 y.o. female with:  Mitral regurgitation S/p mitral valve repair in 2007 S/p VF arrest in the setting of hypokalemia s/p ICD (HFpEF) heart failure with preserved ejection fraction  SVT Sinus node dysfunction  Paroxysmal atrial fibrillation/flutter S/p DCCV 01/2019 Hypertension Hyperlipidemia Hypothyroidism  Prior CV studies: Pulmonary Function Test January 12, 2021 FVC 78% FEV1 87% FEV1/FVC 110% TLC 74% DLCO 78%  Good patient effort. The results of this test meets ATS standards for acceptability and repeatability. Hgb default value of 13.4 used The FVC is reduced, but the FEV1/FVC ratio is increased. The airway resistance is normal. The TLC or alveolar volume is reduced, but the SVC is within normal limits. Following administration of bronchodilators, there is no significant response. The reduced diffusing capacity indicates a minimal loss of functional alveolar capillary surface. Conclusions: The reduced lung volumes, increased FEV1/FVC ratio and diffusion defect suggest an interstitial process such as fibrosis or interstitial inflammation.  Pulmonary Function Diagnosis: Minimal Restriction -Interstitial Minimal Diffusion Defect  GATED SPECT MYO PERF W/LEXISCAN STRESS 1D 01/17/2021 Narrative  The left ventricular ejection fraction is normal (55-65%).  Nuclear stress EF: 55%.  There was no ST segment deviation noted during stress.  Defect 1: There is a medium defect of moderate severity present in the basal inferior, apical inferior and apex location.  This is a low risk study. Mildly abnormal,  low risk stress nuclear study with inferior apical thinning versus small prior infarct.  No ischemia.  Gated ejection fraction 55% with normal wall motion.  Echocardiogram 10/25/2020 Inf/inf-lat HK, EF 60-65, mild LVH, normal RVSF, mild LAE, MV repair ok with mild MR, mild MS (mean 11, peak 27)   1. Basal inferior/inferolateral hypokinesis. Left ventricular ejection  fraction, by estimation, is 60 to 65%. The left ventricle has normal  function. There is mild left ventricular hypertrophy. Left ventricular  diastolic parameters are indeterminate.   2. Right ventricular systolic function is normal. The right ventricular  size is normal.   3. Left atrial size was mildly dilated.   4. S/p MV repair (Simplicity annuloplasty band, 08/15/05) Peak and mean  gradients through the valve are 27 and 11 mm Hg respectively. Mild mitral  stenosis. No significant change from echo done in 12/09/18. The mitral  valve has been repaired/replaced. Mild   mitral valve regurgitation.   5. The aortic valve is normal in structure. Aortic valve regurgitation is  not visualized.   6. The inferior vena cava is normal in size with <50% respiratory  variability, suggesting right atrial pressure of 8 mmHg.   History of Present Illness: Nicole Hampton was previously followed at [facility].  She has a history of mitral valve repair in 2007.  She also has a prior history of VF arrest in the setting of severe hypokalemia and is status post ICD.  Our records indicate she has a history of CAD and underwent CABG as well.  However, I cannot find record of this in Care Everywhere.  She was seen in March 2022 after being evaluated by primary care.  Apparently  chest x-ray demonstrated edema and she was placed on furosemide with improved symptoms.  A follow-up echocardiogram demonstrated normal EF with stable mitral valve repair.  She followed up with Dr. Rayann Heman via telemedicine 12/29/2020.  She continued to have symptoms of shortness of  breath.  A nuclear stress test was arranged and demonstrated EF 55, inferoapical thinning versus infarct and no ischemia.  Study was felt to be low risk.  She also underwent pulmonary function testing.  This demonstrated reduced lung volumes, increased FEV1/FVC ratio and diffusion defect suggestive of an interstitial process such as fibrosis or interstitial inflammation.  She returns for follow-up.  She is here alone.  She notes that she had pneumonia back in the winter.  She has had issues with shortness of breath since then.  [place].  She sometimes feels more short of breath going up hills when she is there.  She has not had orthopnea, PND, chest pain.  Before she took Lasix initially, she did have leg edema.  She has not had any significant leg edema since then.  She has not had syncope.  She deftly feels better when she takes the Lasix.    Past Medical History:  Diagnosis Date   BCC (basal cell carcinoma)    SEES DERM.   Complication of anesthesia    extremem nausea and vomiting   Hyperlipidemia    WITH ELEVATED LDL/HDL CLEAN CORS 2006. DR. Nicole Kindred SIMMONS   Hypertension    Hypokalemia    CHRONIC   Hypothyroidism    Lactose intolerance    OA (osteoarthritis)    HANDS AND FEET   Pancreatitis 1982   GB   Persistent atrial fibrillation (HCC)    also typical atrial flutter; she reports prior ablations with Dr Rosita Fire, no records available   Rheumatic fever    as a child   S/P cholecystectomy    OPEN   Sudden cardiac death ([place]) [place]. AICD   Vertigo    RECURRENT    Current Medications: Current Meds  Medication Sig   aMILoride (MIDAMOR) 5 MG tablet Take 5 mg by mouth daily.   Ascorbic Acid (VITAMIN C PO) Take 1 tablet by mouth daily.   azelastine (ASTELIN) 0.1 % nasal spray Place 1 spray into both nostrils 2 (two) times daily as needed for rhinitis. Use in each nostril as directed   Calcium Carbonate-Vitamin D (CALTRATE 600+D  PO) Take 3 tablets by mouth daily.    ELIQUIS 5 MG TABS tablet TAKE ONE TABLET TWICE DAILY   estradiol (ESTRACE) 2 MG tablet Take 1 mg by mouth at bedtime.   ezetimibe (ZETIA) 10 MG tablet Take 10 mg by mouth daily.   fexofenadine (ALLEGRA) 180 MG tablet Take 180 mg by mouth daily as needed for allergies or rhinitis.   fluticasone (FLONASE) 50 MCG/ACT nasal spray Place 2 sprays into both nostrils daily as needed for allergies.    levothyroxine (SYNTHROID) 25 MCG tablet Take 25 mcg by mouth daily before breakfast.   Magnesium Cl-Calcium Carbonate (SLOW-MAG PO) Take 1 tablet by mouth daily.   meclizine (ANTIVERT) 12.5 MG tablet Take 12.5 mg by mouth 3 (three) times daily as needed for dizziness.   Multiple Vitamin (MULTIVITAMIN) tablet Take 1 tablet by mouth once a week.    omega-3 acid ethyl esters (LOVAZA) 1 g capsule Take 1 g by mouth daily.    temazepam (RESTORIL) 7.5 MG capsule Take 7.5 mg by mouth  at bedtime as needed for sleep.    [DISCONTINUED] furosemide (LASIX) 20 MG tablet Take 10 mg by mouth every morning.   [DISCONTINUED] pindolol (VISKEN) 5 MG tablet Patient takes 3.75 mg by mouth daily   [DISCONTINUED] potassium chloride (MICRO-K) 10 MEQ CR capsule Take 40 mEq by mouth See admin instructions. Take 40 meq  twice a day     Allergies:   Cefuroxime axetil, Codeine, Morphine, Quinidine gluconate, Other, and Latex   Social History   Tobacco Use   Smoking status: Former    Types: Cigarettes    Quit date: 06/23/1975    Years since quitting: 45.6   Smokeless tobacco: Never  Vaping Use   Vaping Use: Never used  Substance Use Topics   Alcohol use: Not Currently   Drug use: No     Family Hx: The patient's family history includes ALS in her sister; Arthritis (age of onset: 75) in her mother; Hypertension (age of onset: 76) in her father; Macular degeneration in her brother; Pancreatic cancer in her brother.  Review of Systems  Gastrointestinal:  Negative for hematochezia.   Genitourinary:  Negative for hematuria.    EKGs/Labs/Other Test Reviewed:    EKG:  EKG is not ordered today.  The ekg ordered today demonstrates N/A  Recent Labs: 09/30/2020: BUN 19; Creatinine, Ser 0.82; Hemoglobin 13.4; NT-Pro BNP 621; Platelets 271; Potassium 5.0; Sodium 140   Recent Lipid Panel No results found for: CHOL, TRIG, HDL, LDLCALC, LDLDIRECT    Risk Assessment/Calculations:    CHA2DS2-VASc Score = 4  This indicates a 4.8% annual risk of stroke. The patient's score is based upon: CHF History: Yes HTN History: Yes Diabetes History: No Stroke History: No Vascular Disease History: No Age Score: 1 Gender Score: 1    Physical Exam:    VS:  BP 120/72   Pulse 74   Ht 5\' 2"  (1.575 m)   Wt 118 lb 12.8 oz (53.9 kg)   SpO2 97%   BMI 21.73 kg/m     Wt Readings from Last 3 Encounters:  02/07/21 118 lb 12.8 oz (53.9 kg)  01/17/21 116 lb (52.6 kg)  12/29/20 116 lb (52.6 kg)     Constitutional:      Appearance: Healthy appearance. Not in distress.  Neck:     Vascular: JVD normal.  Pulmonary:     Effort: Pulmonary effort is normal.     Breath sounds: No wheezing. No rales.  Cardiovascular:     Normal rate. Regular rhythm. Normal S1. Normal S2.      Murmurs: There is no murmur.  Edema:    Peripheral edema absent.  Abdominal:     Palpations: Abdomen is soft.  Skin:    General: Skin is warm and dry.  Neurological:     Mental Status: Alert and oriented to person, place and time.     Cranial Nerves: Cranial nerves are intact.        ASSESSMENT & PLAN:    1. Shortness of breath She has had shortness of breath with exertion for the last several months.  She has had some evidence of volume excess with improvement with diuresis.  Her echocardiogram in April did demonstrate normal LV function.  There was inferolateral hypokinesis.  There was mild MR and her mean mitral valve gradient was 11.  Her recent nuclear stress test demonstrated no ischemia.  There was  apical thinning versus infarct and her EF was normal.  The study was felt to be low risk.  Her PFTs demonstrated findings suggestive of an interstitial process such as fibrosis or interstitial inflammation.  Given the inferior abnormalities on her stress test and echocardiogram, I reviewed her case today with Dr. Johnsie Cancel.  He did not recommend proceeding with cardiac catheterization.  We do feel she needs pulmonary evaluation.  We will arrange a high-resolution chest CT.  We will refer her to pulmonology.  Her mitral valve gradient is somewhat elevated.  Dr. Johnsie Cancel recommended increasing her beta-blocker and taking a regular dose of furosemide.  She is somewhat concerned about taking furosemide on a regular basis given her prior bladder issues.  Therefore, I recommended taking furosemide 3 times a week.  Increase pindolol to 5 mg daily.  I will have her follow-up with Dr. Johney Frame and me going forward for general cardiology.  F/u in the next 2 to 3 months.  2. Chronic heart failure with preserved ejection fraction (HCC) NYHA II.  Volume status currently looks stable.  EF was normal on echocardiogram in April.  As noted, we will adjust her furosemide from as needed to furosemide 10 mg 3 times a week.  She will take an extra potassium 10 mEq on her furosemide days.  Obtain follow-up BMET in 2 weeks.  3. Persistent atrial fibrillation (HCC) Maintaining sinus rhythm on exam.  She is tolerating anticoagulation.  Continue current dose of Apixaban.  Obtain follow-up BMET, CBC in 2 weeks.  4. Mitral valve insufficiency, unspecified etiology 5. S/P MVR (mitral valve repair) As noted, I reviewed her echocardiogram with Dr. Johnsie Cancel today (attending MD).  Her mitral valve gradients are somewhat elevated.  We will increase her beta-blocker and have her take regular diuretic therapy.  She will likely need follow-up echocardiography at some point in the next 6 to 12 months.  6. Ventricular fibrillation ([place]) 7. ICD  (implantable cardioverter-defibrillator) in place Continue follow-up with EP as planned       Dispo:  Return in about 3 months (around 05/10/2021) for Routine follow up in 3 months with Richardson Dopp, PA-C..   Medication Adjustments/Labs and Tests Ordered: Current medicines are reviewed at length with the patient today.  Concerns regarding medicines are outlined above.  Tests Ordered: Orders Placed This Encounter  Procedures   CT Chest High Resolution   Basic Metabolic Panel (BMET)   CBC   Ambulatory referral to Pulmonology    Medication Changes: Meds ordered this encounter  Medications   furosemide (LASIX) 20 MG tablet    Sig: Take 0.5 tablets (10 mg total) by mouth 3 (three) times a week.    Dispense:  15 tablet    Refill:  11   pindolol (VISKEN) 5 MG tablet    Sig: Take 1 tablet (5 mg total) by mouth daily.    Dispense:  30 tablet    Refill:  11   DISCONTD: potassium chloride (MICRO-K) 10 MEQ CR capsule    Sig: Take 2 capsules (20 mEq total) by mouth 2 (two) times daily. On day of Lasix take one tablet by mouth extra ( 10 meq) X 3 weekly.    Dispense:  120 capsule    Refill:  [address], Richardson Dopp, [place]  02/07/2021 [facility] [place], [place], [place]  [address] Phone: [phone]; Fax: [phone]

## 2021-02-14 ENCOUNTER — Institutional Professional Consult (permissible substitution): Payer: Medicare Other | Admitting: Internal Medicine

## 2021-03-09 ENCOUNTER — Ambulatory Visit (INDEPENDENT_AMBULATORY_CARE_PROVIDER_SITE_OTHER)
Admission: RE | Admit: 2021-03-09 | Discharge: 2021-03-09 | Disposition: A | Discharge status: Home / Self Care | Payer: Medicare Other | Source: Ambulatory Visit | Attending: Physician Assistant | Admitting: Physician Assistant

## 2021-03-09 ENCOUNTER — Other Ambulatory Visit: Payer: Self-pay

## 2021-03-09 DIAGNOSIS — Z9889 Other specified postprocedural states: Secondary | ICD-10-CM

## 2021-03-09 DIAGNOSIS — I340 Nonrheumatic mitral (valve) insufficiency: Secondary | ICD-10-CM

## 2021-03-09 DIAGNOSIS — I4819 Other persistent atrial fibrillation: Secondary | ICD-10-CM | POA: Diagnosis not present

## 2021-03-09 DIAGNOSIS — R0602 Shortness of breath: Secondary | ICD-10-CM | POA: Diagnosis not present

## 2021-03-09 DIAGNOSIS — I5032 Chronic diastolic (congestive) heart failure: Secondary | ICD-10-CM

## 2021-03-09 DIAGNOSIS — Z95810 Presence of automatic (implantable) cardiac defibrillator: Secondary | ICD-10-CM

## 2021-03-09 DIAGNOSIS — I4901 Ventricular fibrillation: Secondary | ICD-10-CM

## 2021-03-16 ENCOUNTER — Institutional Professional Consult (permissible substitution): Payer: Medicare Other | Admitting: Internal Medicine

## 2021-03-31 ENCOUNTER — Other Ambulatory Visit: Payer: Self-pay | Admitting: Internal Medicine

## 2021-03-31 DIAGNOSIS — Z1231 Encounter for screening mammogram for malignant neoplasm of breast: Secondary | ICD-10-CM

## 2021-04-04 ENCOUNTER — Encounter: Payer: Self-pay | Admitting: Internal Medicine

## 2021-04-04 ENCOUNTER — Ambulatory Visit: Payer: Medicare Other | Admitting: Internal Medicine

## 2021-04-04 ENCOUNTER — Other Ambulatory Visit: Payer: Self-pay

## 2021-04-04 ENCOUNTER — Ambulatory Visit (INDEPENDENT_AMBULATORY_CARE_PROVIDER_SITE_OTHER): Payer: Medicare Other

## 2021-04-04 VITALS — BP 118/76 | HR 90 | Ht 62.0 in | Wt 116.6 lb

## 2021-04-04 DIAGNOSIS — I484 Atypical atrial flutter: Secondary | ICD-10-CM

## 2021-04-04 DIAGNOSIS — I4901 Ventricular fibrillation: Secondary | ICD-10-CM

## 2021-04-04 DIAGNOSIS — I4819 Other persistent atrial fibrillation: Secondary | ICD-10-CM | POA: Diagnosis not present

## 2021-04-04 DIAGNOSIS — Z9889 Other specified postprocedural states: Secondary | ICD-10-CM

## 2021-04-04 NOTE — Patient Instructions (Addendum)
Medication Instructions:  Your physician recommends that you continue on your current medications as directed. Please refer to the Current Medication list given to you today.  Labwork: None ordered.  Testing/Procedures: None ordered.  Follow-Up: Your physician wants you to follow-up in: 04/10/22 at 2 pm with Dr. Rayann Heman.   Any Other Special Instructions Will Be Listed Below (If Applicable).  If you need a refill on your cardiac medications before your next appointment, please call your pharmacy.

## 2021-04-04 NOTE — Progress Notes (Signed)
PCP: Shon Baton, MD Primary Cardiologist: Gwyndolyn Kaufman Primary EP: Dr Rayann Heman  Nicole Hampton is a 75 y.o. female who presents today for routine electrophysiology followup.  Since last being seen in our clinic, the patient reports doing very well.   SOB is stable.  Scott Weaver's note from 7/22 is reviewed. Today, she denies symptoms of palpitations, chest pain, lower extremity edema, dizziness, presyncope, syncope, or ICD shocks.  The patient is otherwise without complaint today.   Past Medical History:  Diagnosis Date   BCC (basal cell carcinoma)    SEES DERM.   Complication of anesthesia    extremem nausea and vomiting   Hyperlipidemia    WITH ELEVATED LDL/HDL CLEAN CORS 2006. DR. 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   Vertigo    RECURRENT   Past Surgical History:  Procedure Laterality Date   CARDIOVERSION N/A 02/11/2019   Procedure: CARDIOVERSION;  Surgeon: Elouise Munroe, MD;  Location: [facility] ENDOSCOPY;  Service: Cardiovascular;  Laterality: N/A;   CESAREAN SECTION     x1   ICD GENERATOR CHANGE  05/25/2014   MDT Gwyneth Revels S DR ICD implanted by Dr Rosita Fire (secondary prevention)   MITRAL VALVE REPAIR (MV)/CORONARY ARTERY BYPASS GRAFTING (CABG)  2006   NV ISSUES PROBABLY FROM RHEUMATIC FEVER   OPEN HEART SURGERY WITH DEFIB  2006   1996    ROS- all systems are reviewed and negative except as per HPI above  Current Outpatient Medications  Medication Sig Dispense Refill   aMILoride (MIDAMOR) 5 MG tablet Take 5 mg by mouth daily.     Ascorbic Acid (VITAMIN C PO) Take 1  tablet by mouth daily.     azelastine (ASTELIN) 0.1 % nasal spray Place 1 spray into both nostrils 2 (two) times daily as needed for rhinitis. Use in each nostril as directed     Calcium Carbonate-Vitamin D (CALTRATE 600+D PO) Take 3 tablets by mouth daily.      ELIQUIS 5 MG TABS tablet TAKE ONE TABLET TWICE DAILY 60 tablet 6   estradiol (ESTRACE) 2 MG tablet Take 1 mg by mouth at bedtime.     ezetimibe (ZETIA) 10 MG tablet Take 10 mg by mouth daily.     fexofenadine (ALLEGRA) 180 MG tablet Take 180 mg by mouth daily as needed for allergies or rhinitis.     fluticasone (FLONASE) 50 MCG/ACT nasal spray Place 2 sprays into both nostrils daily as needed for allergies.      furosemide (LASIX) 20 MG tablet Take 0.5 tablets (10 mg total) by mouth 3 (three) times a week. 15 tablet 11   levothyroxine (SYNTHROID) 25 MCG tablet Take 25 mcg by mouth daily before breakfast.     Magnesium Cl-Calcium Carbonate (SLOW-MAG PO) Take 1 tablet by mouth daily.     meclizine (ANTIVERT) 12.5 MG tablet Take 12.5 mg by mouth 3 (three) times daily as needed for dizziness.     Multiple Vitamin (MULTIVITAMIN) tablet Take 1 tablet by mouth  once a week.      omega-3 acid ethyl esters (LOVAZA) 1 g capsule Take 1 g by mouth daily.      pindolol (VISKEN) 5 MG tablet Take 1 tablet (5 mg total) by mouth daily. 30 tablet 11   potassium chloride (MICRO-K) 10 MEQ CR capsule Take 4 capsules (40 mEq total) by mouth 2 (two) times daily. On day of Lasix take one tablet by mouth extra ( 10 meq) X 3 weekly. 240 capsule 11   temazepam (RESTORIL) 7.5 MG capsule Take 7.5 mg by mouth at bedtime as needed for sleep.      No current facility-administered medications for this visit.    Physical Exam: Vitals:   04/04/21 1417  BP: 118/76  Pulse: 90  SpO2: 98%  Weight: 116 lb 9.6 oz (52.9 kg)  Height: '5\' 2"'$  (1.575 m)    GEN- The patient is well appearing, alert and oriented x 3 today.   Head- normocephalic, atraumatic Eyes-  Sclera  clear, conjunctiva pink Ears- hearing intact Oropharynx- clear Lungs- Clear to ausculation bilaterally, normal work of breathing Chest- ICD pocket is well healed Heart- Regular rate and rhythm, no murmurs, rubs or gallops, PMI not laterally displaced GI- soft, NT, ND, + BS Extremities- no clubbing, cyanosis, or edema  ICD interrogation- reviewed in detail today,  See PACEART report  ekg tracing ordered today is personally reviewed and shows atrial paced, PR 270 msec, QRS 92 msec  Wt Readings from Last 3 Encounters:  04/04/21 116 lb 9.6 oz (52.9 kg)  02/07/21 118 lb 12.8 oz (53.9 kg)  01/17/21 116 lb (52.6 kg)    Assessment and Plan:  1.  Prior VF arrest Normal ICD function See Pace Art report No changes today she is not device dependant today Chronically elevated RV threshold but does not RV pace She is approaching ERI (7 months) Risks, benefits, and alternatives to ICD pulse generator replacement were discussed in detail today.  The patient understands that risks include but are not limited to bleeding, infection, pneumothorax, perforation, tamponade, vascular damage, renal failure, MI, stroke, death, inappropriate shocks, damage to his existing leads, and lead dislodgement and wishes to proceed with generator change once ERI. Hold eliquis 24 hours prior to the procedure.   2. Paroxysmal atrial fibrillation Well controlled by ICD  3. CAD s/p CABG and MVR Stable No change required today Recent myoview did not show ischemia PFTs showed interstitial process such as fibrosis or inflammation  4. SOB Ongoing Unclear etiology PFTs are reviewed and raise concern for an interstitial/ fibrotic pulmonary process High resolution CT was low risk She has scheduled follow-up with Dr Melvyn Novas to further evaluate  Risks, benefits and potential toxicities for medications prescribed and/or refilled reviewed with patient today.   Return to see me in a year unless she reaches ERI in the  interim. Follow-up with Dr Johney Frame as scheduled  Thompson Grayer MD, [facility] 04/04/2021 2:29 PM

## 2021-04-05 LAB — CUP PACEART REMOTE DEVICE CHECK
Battery Remaining Longevity: 7 mo
Battery Voltage: 2.84 V
Brady Statistic AP VP Percent: 0.59 %
Brady Statistic AP VS Percent: 96.34 %
Brady Statistic AS VP Percent: 0.05 %
Brady Statistic AS VS Percent: 3.02 %
Brady Statistic RA Percent Paced: 93.48 %
Brady Statistic RV Percent Paced: 0.76 %
Date Time Interrogation Session: 20220912043823
HighPow Impedance: 39 Ohm
HighPow Impedance: 50 Ohm
Implantable Lead Implant Date: 20030319
Implantable Lead Implant Date: 20141103
Implantable Lead Location: 753859
Implantable Lead Location: 753860
Implantable Lead Model: 5076
Implantable Lead Model: 6947
Implantable Pulse Generator Implant Date: 20171103
Lead Channel Impedance Value: 399 Ohm
Lead Channel Impedance Value: 665 Ohm
Lead Channel Impedance Value: 722 Ohm
Lead Channel Pacing Threshold Amplitude: 0.625 V
Lead Channel Pacing Threshold Amplitude: 2.5 V
Lead Channel Pacing Threshold Pulse Width: 0.4 ms
Lead Channel Pacing Threshold Pulse Width: 0.4 ms
Lead Channel Sensing Intrinsic Amplitude: 1.75 mV
Lead Channel Sensing Intrinsic Amplitude: 1.75 mV
Lead Channel Sensing Intrinsic Amplitude: 3.5 mV
Lead Channel Sensing Intrinsic Amplitude: 3.5 mV
Lead Channel Setting Pacing Amplitude: 2 V
Lead Channel Setting Pacing Amplitude: 4.5 V
Lead Channel Setting Pacing Pulse Width: 1 ms
Lead Channel Setting Sensing Sensitivity: 0.45 mV

## 2021-04-07 ENCOUNTER — Other Ambulatory Visit: Payer: Self-pay

## 2021-04-07 ENCOUNTER — Ambulatory Visit: Payer: Medicare Other | Admitting: Internal Medicine

## 2021-04-07 ENCOUNTER — Encounter: Payer: Self-pay | Admitting: Internal Medicine

## 2021-04-07 DIAGNOSIS — R0600 Dyspnea, unspecified: Secondary | ICD-10-CM | POA: Diagnosis not present

## 2021-04-07 DIAGNOSIS — R0609 Other forms of dyspnea: Secondary | ICD-10-CM | POA: Insufficient documentation

## 2021-04-07 NOTE — Progress Notes (Addendum)
Nicole Hampton, female    DOB: 01-Jul-1946,   MRN: [id number]   Brief patient profile:  3 yowf  never smoker/never resp dz  referred to pulmonary clinic 04/07/2021 for sob  x March 2022 dx of pna  with nl pfts 12/30/20 and nl HRCT chest 81722 x for 4 mm nodule.     History of Present Illness  04/07/2021  Pulmonary/ 1st office eval/Akaiya Touchette  Chief Complaint  Patient presents with   Pulmonary Consult    Referred by Richardson Dopp, PA. Pt c/o SOB since had PNA March 2022. She states she is SOB usually only with exertion, and this comes and goes with no specific trigger.   Was able to do treadmill 45 min at ? Speed ? Tilt (but fast walk) in Feb 2022  While in [place] 1st of March fatigue/sob/low grade / neg covid then dx by Virgina Jock with pna by cxr cleared on amox and breathing improved can do 45 min again not at 100% doing 3 days to Dyspnea:  spells are at least and to occur if speeds up quickly but if goes slow and works up to it does fine sitting and lying down - only lasts a few minutes  Cough: none  Sleep: fine  SABA use: none   Past Medical History:  Diagnosis Date   BCC (basal cell carcinoma)    SEES DERM.   Complication of anesthesia    extremem nausea and vomiting   Hyperlipidemia    WITH ELEVATED LDL/HDL CLEAN CORS 2006. DR. 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     Ascorbic Acid (VITAMIN C PO) Take 1 tablet by mouth daily.      azelastine (ASTELIN) 0.1 % nasal spray Place 1 spray into both nostrils 2 (two) times daily as needed for rhinitis. Use in each nostril as directed     Calcium Carbonate-Vitamin D (CALTRATE 600+D PO) Take 3 tablets by mouth daily.      ELIQUIS 5 MG TABS tablet TAKE ONE TABLET TWICE DAILY 60 tablet 6   estradiol (ESTRACE) 2 MG tablet Take 1 mg by mouth at bedtime.     ezetimibe (ZETIA) 10 MG tablet Take 10 mg by mouth daily.     fexofenadine (ALLEGRA) 180 MG tablet Take 180 mg by mouth daily as needed for allergies or rhinitis.     fluticasone (FLONASE) 50 MCG/ACT nasal spray Place 2 sprays into both nostrils daily as needed for allergies.      furosemide (LASIX) 20 MG tablet Take 0.5 tablets (10 mg total) by mouth 3 (three) times a week. 15 tablet 11   levothyroxine (SYNTHROID) 25 MCG tablet Take 25 mcg by mouth daily before breakfast.     Magnesium Cl-Calcium Carbonate (SLOW-MAG PO) Take 1 tablet by mouth daily.  meclizine (ANTIVERT) 12.5 MG tablet Take 12.5 mg by mouth 3 (three) times daily as needed for dizziness.     Multiple Vitamin (MULTIVITAMIN) tablet Take 1 tablet by mouth once a week.      omega-3 acid ethyl esters (LOVAZA) 1 g capsule Take 1 g by mouth daily.      pindolol (VISKEN) 5 MG tablet Take 1 tablet (5 mg total) by mouth daily. 30 tablet 11   potassium chloride (MICRO-K) 10 MEQ CR capsule Take 4 capsules (40 mEq total) by mouth 2 (two) times daily. On day of Lasix take one tablet by mouth extra ( 10 meq) X 3 weekly. 240 capsule 11   temazepam (RESTORIL) 7.5 MG capsule Take 7.5 mg by mouth at bedtime as needed for sleep.      No facility-administered medications prior to visit.     Objective:     BP 116/72 (BP Location: Left Arm, Cuff Size: Normal)   Pulse 73   Temp (!) 97.5 F (36.4 C) (Oral)   Ht 5' 2.5" (1.588 m)   Wt 120 lb (54.4 kg)   SpO2 99% Comment: on RA  BMI 21.60 kg/m   SpO2: 99 % (on RA)  Pleasant amb wf nad   HEENT : pt wearing mask not removed  for exam due to covid -19 concerns.    NECK :  without JVD/Nodes/TM/ nl carotid upstrokes bilaterally   LUNGS: no acc muscle use,  Nl contour chest which is clear to A and P bilaterally without cough on insp or exp maneuvers   CV:  RRR  no s3 or murmur or increase in P2, and no edema   ABD:  soft and nontender with nl inspiratory excursion in the supine position. No bruits or organomegaly appreciated, bowel sounds nl  MS:  Nl gait/ ext warm without deformities, calf tenderness, cyanosis or clubbing No obvious joint restrictions   SKIN: warm and dry without lesions    NEURO:  alert, approp, nl sensorium with  no motor or cerebellar deficits apparent.    I personally reviewed images and agree with radiology impression as follows:   Chest CT HRCT  03/09/21 No lung parenchymal findings to explain shortness of breath.   Solid 4 mm pulmonary nodule the right lower lobe. No follow-up needed if patient is low-risk. Non-contrast chest CT can be considered in 12 months if patient is high-risk.     Assessment   DOE (dyspnea on exertion) Onset March 2022 not reproducible with ex but more likely on standing quickly - nl pfts 12/30/20 and nl HRCT chest 81722 x for 4 mm nodule. - 04/07/2021   Walked on RA x  3  lap(s) =  approx 750 @ fast pace, stopped due to end of study with lowest 02 sats 96 % and min sob  Unable to reproduce any doe and she tells me the spells come on really s warning but never at rest/ lying down or with sustained ex suggesting it is circulatory in nature, perhaps related to arrythmia or cardiac meds  Encourage regular sub max ex on treadmill and monitor sats at peak ex to detect incipient lung dz but next step if problem continues = CPST which can be arranged in cardiology clinic and pulmonary f/u prn   Each maintenance medication was reviewed in detail including emphasizing most importantly the difference between maintenance and prns and under what circumstances the prns  are to be triggered using an action plan format where appropriate.  Total  time for H and P, chart review, counseling,  directly observing portions of ambulatory 02 saturation study/ and generating customized AVS unique to this office visit / same day charting = 45 min                    Christinia Gully, MD 04/07/2021

## 2021-04-07 NOTE — Assessment & Plan Note (Signed)
Onset March 2022 not reproducible with ex but more likely on standing quickly - nl pfts 12/30/20 and nl HRCT chest 81722 x for 4 mm nodule. - 04/07/2021   Walked on RA x  3  lap(s) =  approx 750 @ fast pace, stopped due to end of study with lowest 02 sats 96 % and min sob  Unable to reproduce any doe and she tells me the spells come on really s warning but never at rest/ lying down or with sustained ex suggesting it is circulatory in nature, perhaps related to arrythmia or cardiac meds  Encourage regular sub max ex on treadmill and monitor sats at peak ex to detect incipient lung dz but next step if problem continues = CPST which can be arranged in cardiology clinic and pulmonary f/u prn   Each maintenance medication was reviewed in detail including emphasizing most importantly the difference between maintenance and prns and under what circumstances the prns are to be triggered using an action plan format where appropriate.  Total time for H and P, chart review, counseling,  directly observing portions of ambulatory 02 saturation study/ and generating customized AVS unique to this office visit / same day charting = 45 min

## 2021-04-07 NOTE — Patient Instructions (Signed)
Make sure you check your oxygen saturation  at your highest level of activity  to be sure it stays over 90% and adjust  02 flow upward to maintain this level if needed but remember to turn it back to previous settings when you stop (to conserve your supply).   Pulmonary follow up is as needed

## 2021-04-08 NOTE — Progress Notes (Signed)
Remote ICD transmission.   

## 2021-04-18 NOTE — Progress Notes (Signed)
Office Visit Note  Patient: Nicole Hampton             Date of Birth: 11/03/1945           MRN: [id number]             PCP: Shon Baton, MD Referring: Shon Baton, MD Visit Date: 04/28/2021 Occupation: @GUAROCC @  Subjective:  Pain in both hands   History of Present Illness: Nicole Hampton is a 75 y.o. female with a history of osteoarthritis.  Patient was initially evaluated by me in December 2015 for joint pain.  After evaluation and labs she was diagnosed with osteoarthritis of bilateral hands and bilateral feet.  She also had mild Raynauds symptoms.  Her last visit was in October 2016.  She states the joint Arst pain and stiffness has gotten worse over the years.  She has noticed increased pain in her hands and decreased grip strength in her hands.  She likes to paint and it is difficult for her to hold the paint brush.  As she has not noticed any progression of arthritis in her feet.  None of the other joints are painful.  There is no history of joint swelling.  There is history of osteoarthritis in her mother and maternal grandmother.  She is gravida 1, para 1.  No history of DVTs.  Activities of Daily Living:  Patient reports morning stiffness for 0 minutes.   Patient Reports nocturnal pain.  Difficulty dressing/grooming: Denies Difficulty climbing stairs: Denies Difficulty getting out of chair: Denies Difficulty using hands for taps, buttons, cutlery, and/or writing: Reports  Review of Systems  Constitutional:  Positive for fatigue.  HENT:  Negative for mouth sores, mouth dryness and nose dryness.   Eyes:  Negative for pain, itching and dryness.  Respiratory:  Positive for shortness of breath. Negative for difficulty breathing.   Cardiovascular:  Positive for palpitations. Negative for chest pain.  Gastrointestinal:  Negative for blood in stool, constipation and diarrhea.  Endocrine: Negative for increased urination.  Genitourinary:  Negative for difficulty urinating.   Musculoskeletal:  Positive for joint pain, joint pain, joint swelling and muscle tenderness. Negative for myalgias, morning stiffness and myalgias.  Skin:  Positive for color change. Negative for rash and redness.  Allergic/Immunologic: Negative for susceptible to infections.  Neurological:  Negative for dizziness, numbness, headaches and memory loss.  Hematological:  Negative for bruising/bleeding tendency.  Psychiatric/Behavioral:  Negative for confusion.    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   Complication of anesthesia    extremem nausea and vomiting   Hyperlipidemia    WITH ELEVATED LDL/HDL CLEAN CORS 2006. DR. 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  Vertigo    RECURRENT    Family History  Problem Relation Age of Onset   Arthritis Mother 3       DROWNING WITH HURRICANE [name]   Hypertension Father 64       DROWNING WITH HURRICANE [name]   ALS Sister    Pancreatic cancer Brother    Macular degeneration Brother    Heart Problems Daughter    Past Surgical History:  Procedure Laterality Date   CARDIOVERSION N/A 02/11/2019   Procedure: CARDIOVERSION;  Surgeon: Elouise Munroe, MD;  Location: [facility] ENDOSCOPY;  Service: Cardiovascular;  Laterality: N/A;   CESAREAN SECTION     x1   ICD GENERATOR CHANGE   05/25/2014   MDT Gwyneth Revels S DR ICD implanted by Dr Rosita Fire (secondary prevention)   MITRAL VALVE REPAIR (MV)/CORONARY ARTERY BYPASS GRAFTING (CABG)  2006   NV ISSUES PROBABLY FROM RHEUMATIC FEVER   OPEN HEART SURGERY WITH DEFIB  2006   1996   Social History   Social History Narrative   Not on file   Immunization History  Administered Date(s) Administered   PFIZER Comirnaty(Gray Top)Covid-19 Tri-Sucrose Vaccine 09/18/2019, 10/14/2019   PFIZER(Purple Top)SARS-COV-2 Vaccination 09/18/2019, 10/14/2019, 04/08/2020     Objective: Vital Signs: BP 114/71 (BP Location: Right Arm, Patient Position: Sitting, Cuff Size: Normal)   Pulse 75   Ht 5' 2.5" (1.588 m)   Wt 121 lb (54.9 kg)   BMI 21.78 kg/m    Physical Exam Vitals and nursing note reviewed.  Constitutional:      Appearance: She is well-developed.  HENT:     Head: Normocephalic and atraumatic.  Eyes:     Conjunctiva/sclera: Conjunctivae normal.  Cardiovascular:     Rate and Rhythm: Normal rate and regular rhythm.     Heart sounds: Normal heart sounds.  Pulmonary:     Effort: Pulmonary effort is normal.     Breath sounds: Normal breath sounds.  Abdominal:     General: Bowel sounds are normal.     Palpations: Abdomen is soft.  Musculoskeletal:     Cervical back: Normal range of motion.  Lymphadenopathy:     Cervical: No cervical adenopathy.  Skin:    General: Skin is warm and dry.     Capillary Refill: Capillary refill takes less than 2 seconds.  Neurological:     Mental Status: She is alert and oriented to person, place, and time.  Psychiatric:        Behavior: Behavior normal.     Musculoskeletal Exam: C-spine, thoracic and lumbar spine were in good range of motion.  Shoulder joints, elbow joints, wrist joints with good range of motion.  She had no MCP swelling or tenderness.  She had bilateral PIP and DIP thickening.  Subluxation of right first DIP joint was noted.  Bilateral CMC prominence was noted.  Hip joints and  knee joints in good range of motion.  She had overcrowding of the toes with PIP and DIP thickening.  No synovitis was noted.  CDAI Exam: CDAI Score: -- Patient Global: --; Provider Global: -- Swollen: --; Tender: -- Joint Exam 04/28/2021   No joint exam has been documented for this visit   There is currently no information documented on the homunculus. Go to the Rheumatology activity and complete the homunculus joint exam.  Investigation: No additional findings.  Imaging: CUP PACEART REMOTE DEVICE CHECK  Result Date: 04/05/2021 Scheduled remote reviewed. Normal device function.  Hx of PAF, 32 of 37 episodes pace terminated, 86.5% Burden <0.1%,  longest episode 57min, Eliquis prescribed Next remote 91 days. LR  XR Hand 2 View Left  Result Date: 04/28/2021 CMC narrowing and spurring was noted.  Third MCP narrowing was noted.  PIP and DIP narrowing was noted.  No intercarpal or radiocarpal joint space narrowing was noted. Impression: These findings are consistent with osteoarthritis of the hand.    XR Hand 2 View Right  Result Date: 04/28/2021 CMC, first MCP, PIP and DIP narrowing was noted.  No intercarpal or radiocarpal joint space narrowing was noted.  No erosive changes were noted. Impression: These findings are consistent with osteoarthritis of the hand.   Recent Labs: Lab Results  Component Value Date   WBC 4.7 09/30/2020   HGB 13.4 09/30/2020   PLT 271 09/30/2020   NA 140 09/30/2020   K 5.0 09/30/2020   CL 103 09/30/2020   CO2 20 09/30/2020   GLUCOSE 86 09/30/2020   BUN 19 09/30/2020   CREATININE 0.82 09/30/2020   CALCIUM 9.2 09/30/2020   GFRAA >60 01/29/2019    Speciality Comments: No specialty comments available.  Procedures:  No procedures performed Allergies: Cefuroxime axetil, Codeine, Morphine, Quinidine gluconate, Other, and Latex   Assessment / Plan:     Visit Diagnoses: Pain in both hands -she complains of pain and discomfort in her bilateral hands and  decreased grip strength.  She was seen by me last in 2016.  I did not notice any synovitis on the examination.  She had bilateral CMC PIP and DIP thickening.  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  She is having problems with grip strength.  Plan: XR Hand 2 View Right, XR Hand 2 View Left.  X-ray findings were discussed with the patient.  X-ray showed CMC, PIP and DIP narrowing.  Some of the MCP changes were also noted due to overuse of her hands.  Joint protection muscle strengthening was discussed.  Exercises were demonstrated in the office and a handout was given.  She has been using Voltaren gel which has been helpful.  I also discussed referral to occupational therapy but she would like to think about it.  His splint for right index finger was also discussed.  She states she has tried the splint in the past but could not hold it in place.  Primary osteoarthritis of both hands-detailed counseling regarding osteoarthritis was provided.  Primary osteoarthritis of both feet-she has first MTP thickening and PIP and DIP prominence.  She also has overcrowding of the toes.  Exercises for feet were also demonstrated in the office.  Proper fitting shoes were discussed.  Toe separators were discussed.  Raynaud's syndrome without gangrene-she had good capillary refill with normal nailbed capillary changes.  Patient states the symptoms are mild.  Essential hypertension-she is on amiloride and her blood pressure is well controlled.  Atypical atrial flutter (HCC)  Chronic anticoagulation-she is on Eliquis for atrial flutter.  History of hyperlipidemia-she is on Zetia which she is tolerating well.  History of hypothyroidism  Osteoporosis screening-patient states she gets DEXA scans through Dr. Virgina Jock and her bone density is normal.  I do not have those results available to review.  Orders: Orders Placed This Encounter  Procedures   XR Hand 2 View Right   XR Hand 2 View Left    No orders of the  defined types were placed in this encounter.    Follow-Up Instructions: Return for Osteoarthritis.   Bo Merino, MD  Note - This record has been created using  Editor, commissioning.  Chart creation errors have been sought, but may not always  have been located. Such creation errors do not reflect on  the standard of medical care.

## 2021-04-27 ENCOUNTER — Ambulatory Visit: Payer: Medicare Other | Admitting: Physician Assistant

## 2021-04-28 ENCOUNTER — Encounter: Payer: Self-pay | Admitting: Rheumatology

## 2021-04-28 ENCOUNTER — Ambulatory Visit: Payer: Medicare Other | Admitting: Rheumatology

## 2021-04-28 ENCOUNTER — Ambulatory Visit: Payer: Self-pay

## 2021-04-28 ENCOUNTER — Other Ambulatory Visit (HOSPITAL_COMMUNITY): Payer: Self-pay | Admitting: Physician Assistant

## 2021-04-28 ENCOUNTER — Other Ambulatory Visit: Payer: Self-pay

## 2021-04-28 ENCOUNTER — Telehealth: Payer: Self-pay

## 2021-04-28 VITALS — BP 114/71 | HR 75 | Ht 62.5 in | Wt 121.0 lb

## 2021-04-28 DIAGNOSIS — Z8639 Personal history of other endocrine, nutritional and metabolic disease: Secondary | ICD-10-CM

## 2021-04-28 DIAGNOSIS — I7300 Raynaud's syndrome without gangrene: Secondary | ICD-10-CM

## 2021-04-28 DIAGNOSIS — M19042 Primary osteoarthritis, left hand: Secondary | ICD-10-CM

## 2021-04-28 DIAGNOSIS — M79642 Pain in left hand: Secondary | ICD-10-CM | POA: Diagnosis not present

## 2021-04-28 DIAGNOSIS — D6869 Other thrombophilia: Secondary | ICD-10-CM

## 2021-04-28 DIAGNOSIS — M2550 Pain in unspecified joint: Secondary | ICD-10-CM

## 2021-04-28 DIAGNOSIS — M19071 Primary osteoarthritis, right ankle and foot: Secondary | ICD-10-CM

## 2021-04-28 DIAGNOSIS — M19072 Primary osteoarthritis, left ankle and foot: Secondary | ICD-10-CM

## 2021-04-28 DIAGNOSIS — Z7901 Long term (current) use of anticoagulants: Secondary | ICD-10-CM

## 2021-04-28 DIAGNOSIS — M19041 Primary osteoarthritis, right hand: Secondary | ICD-10-CM | POA: Diagnosis not present

## 2021-04-28 DIAGNOSIS — M79641 Pain in right hand: Secondary | ICD-10-CM

## 2021-04-28 DIAGNOSIS — I10 Essential (primary) hypertension: Secondary | ICD-10-CM

## 2021-04-28 DIAGNOSIS — I484 Atypical atrial flutter: Secondary | ICD-10-CM

## 2021-04-28 NOTE — Telephone Encounter (Signed)
[facility] request received to transfer remote monitoring to [facility].  Pt last seen in office on 04/04/21, no indication in office notes of transfer request.    Attempted to reach patient to confirm her wishes to transfer [facility] monitoring.  No answer.  LVM with device clinic # and hours to return call.

## 2021-04-28 NOTE — Patient Instructions (Signed)
Hand Exercises Hand exercises can be helpful for almost anyone. These exercises can strengthen the hands, improve flexibility and movement, and increase blood flow to the hands. These results can make work and daily tasks easier. Hand exercises can be especially helpful for people who have joint pain from arthritis or have nerve damage from overuse (carpal tunnel syndrome). These exercises can also help people who have injured a hand. Exercises Most of these hand exercises are gentle stretching and motion exercises. It is usually safe to do them often throughout the day. Warming up your hands before exercise may help to reduce stiffness. You can do this with gentle massage or by placing your hands in warm water for 10-15 minutes. It is normal to feel some stretching, pulling, tightness, or mild discomfort as you begin new exercises. This will gradually improve. Stop an exercise right away if you feel sudden, severe pain or your pain gets worse. Ask your health care provider which exercises are best for you. Knuckle bend or "claw" fist  Stand or sit with your arm, hand, and all five fingers pointed straight up. Make sure to keep your wrist straight during the exercise. Gently bend your fingers down toward your palm until the tips of your fingers are touching the top of your palm. Keep your big knuckle straight and just bend the small knuckles in your fingers. Hold this position for __________ seconds. Straighten (extend) your fingers back to the starting position. Repeat this exercise 5-10 times with each hand. Full finger fist  Stand or sit with your arm, hand, and all five fingers pointed straight up. Make sure to keep your wrist straight during the exercise. Gently bend your fingers into your palm until the tips of your fingers are touching the middle of your palm. Hold this position for __________ seconds. Extend your fingers back to the starting position, stretching every joint fully. Repeat  this exercise 5-10 times with each hand. Straight fist Stand or sit with your arm, hand, and all five fingers pointed straight up. Make sure to keep your wrist straight during the exercise. Gently bend your fingers at the big knuckle, where your fingers meet your hand, and the middle knuckle. Keep the knuckle at the tips of your fingers straight and try to touch the bottom of your palm. Hold this position for __________ seconds. Extend your fingers back to the starting position, stretching every joint fully. Repeat this exercise 5-10 times with each hand. Tabletop  Stand or sit with your arm, hand, and all five fingers pointed straight up. Make sure to keep your wrist straight during the exercise. Gently bend your fingers at the big knuckle, where your fingers meet your hand, as far down as you can while keeping the small knuckles in your fingers straight. Think of forming a tabletop with your fingers. Hold this position for __________ seconds. Extend your fingers back to the starting position, stretching every joint fully. Repeat this exercise 5-10 times with each hand. Finger spread  Place your hand flat on a table with your palm facing down. Make sure your wrist stays straight as you do this exercise. Spread your fingers and thumb apart from each other as far as you can until you feel a gentle stretch. Hold this position for __________ seconds. Bring your fingers and thumb tight together again. Hold this position for __________ seconds. Repeat this exercise 5-10 times with each hand. Making circles  Stand or sit with your arm, hand, and all five fingers pointed   straight up. Make sure to keep your wrist straight during the exercise. Make a circle by touching the tip of your thumb to the tip of your index finger. Hold for __________ seconds. Then open your hand wide. Repeat this motion with your thumb and each finger on your hand. Repeat this exercise 5-10 times with each hand. Thumb  motion  Sit with your forearm resting on a table and your wrist straight. Your thumb should be facing up toward the ceiling. Keep your fingers relaxed as you move your thumb. Lift your thumb up as high as you can toward the ceiling. Hold for __________ seconds. Bend your thumb across your palm as far as you can, reaching the tip of your thumb for the small finger (pinkie) side of your palm. Hold for __________ seconds. Repeat this exercise 5-10 times with each hand. Grip strengthening  Hold a stress ball or other soft ball in the middle of your hand. Slowly increase the pressure, squeezing the ball as much as you can without causing pain. Think of bringing the tips of your fingers into the middle of your palm. All of your finger joints should bend when doing this exercise. Hold your squeeze for __________ seconds, then relax. Repeat this exercise 5-10 times with each hand. Contact a health care provider if: Your hand pain or discomfort gets much worse when you do an exercise. Your hand pain or discomfort does not improve within 2 hours after you exercise. If you have any of these problems, stop doing these exercises right away. Do not do them again unless your health care provider says that you can. Get help right away if: You develop sudden, severe hand pain or swelling. If this happens, stop doing these exercises right away. Do not do them again unless your health care provider says that you can. This information is not intended to replace advice given to you by your health care provider. Make sure you discuss any questions you have with your health care provider. Document Revised: 10/28/2020 Document Reviewed: 10/28/2020 Elsevier Patient Education  2022 [employer]  

## 2021-04-29 NOTE — Telephone Encounter (Signed)
I spoke with the patient and she do want to be transferred to [facility].

## 2021-05-18 ENCOUNTER — Ambulatory Visit: Payer: Medicare Other | Admitting: Rheumatology

## 2021-07-04 ENCOUNTER — Ambulatory Visit: Payer: Medicare Other | Admitting: Physician Assistant

## 2021-11-07 ENCOUNTER — Other Ambulatory Visit (HOSPITAL_COMMUNITY): Payer: Self-pay | Admitting: Physician Assistant

## 2022-04-10 ENCOUNTER — Encounter: Payer: Medicare Other | Admitting: Internal Medicine

## 2022-05-24 ENCOUNTER — Ambulatory Visit (INDEPENDENT_AMBULATORY_CARE_PROVIDER_SITE_OTHER): Payer: Medicare Other

## 2022-05-24 ENCOUNTER — Ambulatory Visit: Payer: Medicare Other | Admitting: Podiatry

## 2022-05-24 ENCOUNTER — Encounter: Payer: Self-pay | Admitting: Podiatry

## 2022-05-24 DIAGNOSIS — M779 Enthesopathy, unspecified: Secondary | ICD-10-CM

## 2022-05-24 DIAGNOSIS — M722 Plantar fascial fibromatosis: Secondary | ICD-10-CM

## 2022-05-24 NOTE — Progress Notes (Signed)
Subjective:   Patient ID: Nicole Hampton, female   DOB: 76 y.o.   MRN: [id number]   HPI Patient feels like her arches have dropped bilateral and she gets pain in her toes and metatarsals bilateral.  States that she likes to be very active and that her activity levels can be affected by this and she is on her feet at all times.  Patient does not smoke   Review of Systems  All other systems reviewed and are negative.       Objective:  Physical Exam Vitals and nursing note reviewed.  Constitutional:      Appearance: She is well-developed.  Pulmonary:     Effort: Pulmonary effort is normal.  Musculoskeletal:        General: Normal range of motion.  Skin:    General: Skin is warm.  Neurological:     Mental Status: She is alert.     Neurovascular status found to be intact muscle strength was found to be adequate range of motion adequate with patient found to have moderate depression of the arch right over left with very narrow heel diminished fat pad and discomfort around the lesser MPJs bilateral.  Patient is found to have good digital perfusion well-oriented x3     Assessment:  Probability for reduced fat pad low-grade to mid grade inflammatory process around the lesser MPJs bilateral digital deformities structural bunion deformities and a narrow foot structure     Plan:  H&P discussed all conditions and I do think orthotics to try to reduce some of the pressure on her feet would be of best benefit to her discussing what would be required.  Patient wants to have this done and is casted for functional orthotics today  X-rays indicate structural bunion deformity bilateral digital deformities no indications of advanced arthritis stress fracture

## 2022-05-25 ENCOUNTER — Telehealth: Payer: Self-pay | Admitting: Cardiology

## 2022-05-25 NOTE — Telephone Encounter (Signed)
A patient of yours Nicole Hampton), his wife [name] called. She said at his last office visit appointment on 05/10/22, she asked if you would be willing to take her on as a patient as well, and you told her yes. I am wanting to confirm with you before we schedule her for a new patient appointment to see you.

## 2022-05-26 NOTE — Telephone Encounter (Signed)
Yes

## 2022-06-14 ENCOUNTER — Telehealth: Payer: Self-pay | Admitting: Podiatry

## 2022-06-14 NOTE — Telephone Encounter (Signed)
Left message on vm that patients orthotics came in early. No balance

## 2022-06-21 ENCOUNTER — Ambulatory Visit (INDEPENDENT_AMBULATORY_CARE_PROVIDER_SITE_OTHER): Payer: Medicare Other

## 2022-06-21 DIAGNOSIS — M722 Plantar fascial fibromatosis: Secondary | ICD-10-CM

## 2022-06-21 NOTE — Progress Notes (Signed)
Patient presents today to pick up custom molded foot orthotics, diagnosed with plantar fasciitis by Dr. Paulla Dolly.   Orthotics were dispensed and fit was satisfactory. Reviewed instructions for break-in and wear. Written instructions given to patient.  Patient will follow up as needed.   [facility] - order # [id number]

## 2022-07-05 ENCOUNTER — Other Ambulatory Visit: Payer: Medicare Other

## 2022-07-25 ENCOUNTER — Encounter: Payer: Self-pay | Admitting: Cardiology

## 2022-07-25 ENCOUNTER — Ambulatory Visit: Payer: Medicare Other | Admitting: Cardiology

## 2022-07-25 VITALS — BP 148/60 | HR 74 | Ht 62.5 in | Wt 119.6 lb

## 2022-07-25 DIAGNOSIS — I4901 Ventricular fibrillation: Secondary | ICD-10-CM

## 2022-07-25 DIAGNOSIS — I495 Sick sinus syndrome: Secondary | ICD-10-CM

## 2022-07-25 DIAGNOSIS — Z95810 Presence of automatic (implantable) cardiac defibrillator: Secondary | ICD-10-CM | POA: Insufficient documentation

## 2022-07-25 DIAGNOSIS — Z9889 Other specified postprocedural states: Secondary | ICD-10-CM

## 2022-07-25 DIAGNOSIS — I480 Paroxysmal atrial fibrillation: Secondary | ICD-10-CM

## 2022-07-25 NOTE — Progress Notes (Signed)
Primary Physician/Referring:  Shon Baton, MD  Patient ID: Nicole Hampton, female    DOB: 1946/02/26, 77 y.o.   MRN: [id number]  Chief Complaint  Patient presents with   New Patient (Initial Visit)        Atrial Fibrillation   HPI:    Nicole Hampton  is a 77 y.o.  Caucasian female patient with chronotropic incompetence and sinus node dysfunction, paroxysmal atrial fibrillation and atypical atrial flutter, PSVT SP ablation in the remote past, primary hypertension,  mitral valve repair in 2007 and history of recurrent ventricular fibrillation arrest starting in 1986 and in 1996 and had Medtronic Evera dual-chamber ICD and generator change out in Apr 2023.  She presents here to establish cardiac care.  She is presently asymptomatic.  Past Medical History:  Diagnosis Date   Arrhythmia    BCC (basal cell carcinoma)    SEES DERM.   Complication of anesthesia    extremem nausea and vomiting   Hyperlipidemia    WITH ELEVATED LDL/HDL CLEAN CORS 2006. DR. 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   Vertigo    RECURRENT   Past Surgical History:  Procedure Laterality Date   CARDIOVERSION N/A 02/11/2019   Procedure: CARDIOVERSION;  Surgeon: Elouise Munroe, MD;  Location: [facility] ENDOSCOPY;  Service: Cardiovascular;  Laterality: N/A;   CESAREAN SECTION     x1   ICD GENERATOR CHANGE  05/25/2014   MDT Gwyneth Revels S DR ICD implanted by Dr Rosita Fire (secondary prevention)   MITRAL VALVE REPAIR (MV)/CORONARY ARTERY BYPASS GRAFTING (CABG)  2006   NV ISSUES PROBABLY FROM RHEUMATIC FEVER   OPEN HEART SURGERY  WITH DEFIB  2006   1996   Family History  Problem Relation Age of Onset   Arthritis Mother 69       DROWNING WITH HURRICANE [name]   Hypertension Father 68       DROWNING WITH HURRICANE [name]   ALS Sister    Pancreatic cancer Brother    Macular degeneration Brother    Heart Problems Daughter     Social History   Tobacco Use   Smoking status: Never   Smokeless tobacco: Never  Substance Use Topics   Alcohol use: Not Currently   Marital Status: Married  ROS  Review of Systems  Cardiovascular:  Negative for chest pain, dyspnea on exertion and leg swelling.   Objective      07/25/2022   12:37 PM 07/25/2022   12:31 PM 04/28/2021    8:16 AM  Vitals with BMI  Height  5' 2.5"   Weight  119 lbs 10 oz   BMI  01.60   Systolic 109 323 557  Diastolic 60 56 71  Pulse 74 79 75   Physical Exam Neck:     Vascular: No carotid bruit or JVD.  Cardiovascular:     Rate and Rhythm: Normal rate and regular rhythm.     Pulses: Intact distal pulses.     Heart sounds: Normal heart  sounds. No murmur heard.    No gallop.  Pulmonary:     Effort: Pulmonary effort is normal.     Breath sounds: Normal breath sounds.  Abdominal:     General: Bowel sounds are normal.     Palpations: Abdomen is soft.  Musculoskeletal:     Right lower leg: No edema.     Left lower leg: No edema.     Medications and allergies   Allergies  Allergen Reactions   Cefuroxime Axetil Palpitations   Codeine Nausea And Vomiting and Other (See Comments)    Gi Upset    Morphine Palpitations and Rash   Quinidine Gluconate Other (See Comments)    Positive ANA, tachycardia   Amoxicillin-Pot Clavulanate     Other Reaction(s): GI intolerance only   Other Nausea And Vomiting    general anesthesia - severe nausea and vomiting  Other Reaction(s): causes Long QT   Echinacea Nausea And Vomiting   Latex Rash     Medication list   Current Outpatient Medications:    aMILoride (MIDAMOR) 5 MG tablet, Take 5 mg by  mouth daily., Disp: , Rfl:    Ascorbic Acid (VITAMIN C PO), Take 1 tablet by mouth daily., Disp: , Rfl:    azelastine (ASTELIN) 0.1 % nasal spray, Place 1 spray into both nostrils 2 (two) times daily as needed for rhinitis. Use in each nostril as directed, Disp: , Rfl:    Calcium Carbonate-Vitamin D (CALTRATE 600+D PO), Take 3 tablets by mouth daily. , Disp: , Rfl:    dofetilide (TIKOSYN) 250 MCG capsule, Take 250 mcg by mouth 2 (two) times daily., Disp: , Rfl:    ELIQUIS 5 MG TABS tablet, TAKE ONE TABLET TWICE DAILY, Disp: 60 tablet, Rfl: 6   estradiol (ESTRACE) 2 MG tablet, Take 1 mg by mouth at bedtime., Disp: , Rfl:    ezetimibe (ZETIA) 10 MG tablet, Take 10 mg by mouth daily., Disp: , Rfl:    fexofenadine (ALLEGRA) 180 MG tablet, Take 180 mg by mouth daily as needed for allergies or rhinitis., Disp: , Rfl:    fluticasone (FLONASE) 50 MCG/ACT nasal spray, Place 2 sprays into both nostrils daily as needed for allergies. , Disp: , Rfl:    furosemide (LASIX) 20 MG tablet, Take 0.5 tablets (10 mg total) by mouth 3 (three) times a week. (Patient taking differently: Take 10 mg by mouth daily as needed for fluid. Takes 2 - 3 times a month), Disp: 15 tablet, Rfl: 11   levothyroxine (SYNTHROID) 25 MCG tablet, Take 25 mcg by mouth daily before breakfast., Disp: , Rfl:    Magnesium 250 MG TABS, Take 1 tablet by mouth once a day Oral, Disp: , Rfl:    Magnesium Cl-Calcium Carbonate (SLOW-MAG PO), Take 1 tablet by mouth daily., Disp: , Rfl:    meclizine (ANTIVERT) 12.5 MG tablet, Take 12.5 mg by mouth 3 (three) times daily as needed for dizziness., Disp: , Rfl:    Multiple Vitamin (MULTIVITAMIN) tablet, Take 1 tablet by mouth once a week. , Disp: , Rfl:    omega-3 acid ethyl esters (LOVAZA) 1 g capsule, Take 1 g by mouth daily. , Disp: , Rfl:    potassium chloride (MICRO-K) 10 MEQ CR capsule, Take 4 capsules (40 mEq total) by mouth 2 (two) times daily. On day of Lasix take one tablet by mouth extra ( 10 meq)  X 3 weekly., Disp: 240 capsule, Rfl: 11 Laboratory examination:  External labs:   Labs 12/12/2021:  Serum glucose 27 mg, BUN 15, creatinine 0.7, EGFR 82 mL, potassium 4.9, LFTs normal.  Labs 05/18/2022:  Hb 13.7/HCT 36.1, platelets 220, normal indicis.  Total cholesterol 241, triglycerides 155, HDL 70, LDL 140.  Non-HDL cholesterol 171.  TSH normal at 1.86.  Free T4 normal at 1.53.  Vitamin D30.9.  Radiology:    Cardiac Studies:   GATED SPECT MYO PERF W/LEXISCAN STRESS 1D 01/17/2021 Narrative  The left ventricular ejection fraction is normal (55-65%).  Nuclear stress EF: 55%.  There was no ST segment deviation noted during stress.  Defect 1: There is a medium defect of moderate severity present in the basal inferior, apical inferior and apex location.  This is a low risk study. Mildly abnormal, low risk stress nuclear study with inferior apical thinning versus small prior infarct.  No ischemia.  Gated ejection fraction 55% with normal wall motion.   Echocardiogram 10/25/2020  1. Basal inferior/inferolateral hypokinesis. Left ventricular ejection  raction, by estimation, is 60 to 65%. The left ventricle has normal  function. There is mild left ventricular hypertrophy. Left ventricular diastolic parameters are indeterminate.   2. Right ventricular systolic function is normal. The right ventricular size is normal.   3. Left atrial size was mildly dilated.   4. S/p MV repair (Simplicity annuloplasty band, 08/15/05) Peak and mean  gradients through the valve are 27 and 11 mm Hg respectively. Mild mitral stenosis. No significant change from echo done in 12/09/18. The mitral  valve has been repaired/replaced. Mild   mitral valve regurgitation.   5. The inferior vena cava is normal in size with <50% respiratory variability, suggesting right atrial pressure of 8 mmHg.   EKG:   EKG 07/25/2022: Atrially paced and demand ventricularly paced rhythm at rate of 74 bpm, normal axis, no  evidence of ischemia.    Assessment     ICD-10-CM   1. Paroxysmal atrial fibrillation (HCC)  I48.0 EKG 12-Lead    2. Sinus node dysfunction (HCC)  I49.5     3. S/P MVR (mitral valve repair) 2007  Z98.890     4. Ventricular fibrillation ([place]) arest 1986 S/P ICD placement in 1996  I49.01     5. ICD Dual Chamber Medtronic Cobalt XT dual-chamber ICD in 1996 for VF in 1986 arrest  Z95.810       CHA2DS2-VASc Score is 2.  Yearly risk of stroke: 2.3% (F, Age).  Score of 1=0.6; 2=2.2; 3=3.2; 4=4.8; 5=7.2; 6=9.8; 7=>9.8) -(CHF; HTN; vasc disease DM,  Female = 1; Age <65 =0; 65-74 = 1,  >75 =2; stroke/embolism= 2).   Orders Placed This Encounter  Procedures   EKG 12-Lead   No orders of the defined types were placed in this encounter.  Medications Discontinued During This Encounter  Medication Reason   pindolol (VISKEN) 5 MG tablet    temazepam (RESTORIL) 7.5 MG capsule Completed Course     Recommendations:   Nicole Hampton is a 77 y.o.  Caucasian female patient with chronotropic incompetence and sinus node dysfunction, paroxysmal atrial fibrillation and atypical atrial flutter, PSVT SP ablation in the remote past, primary hypertension,  mitral valve repair in 2007 and history of recurrent ventricular fibrillation arrest starting in 1986 and in 1996 and had Medtronic Evera dual-chamber ICD and generator change out in Apr 2023.  She presents here to establish cardiac care.  1. Sinus node dysfunction (HCC) Patient with sinus node dysfunction SP pacemaker/ICD implantation, presently mostly a paced and V sensed rhythm.  Asymptomatic.  She is  presently on Tikosyn and is being monitored by [facility] EP.  She would like to continue receiving care there.  2. Paroxysmal atrial fibrillation (HCC) She is maintaining sinus rhythm.  She has low chads vascular score, although there is history of heart failure and she takes furosemide once or twice a month at 10 mg, today there is no clinical evidence of  heart failure.  Her chads vascular score if heart failure is excluded is only 2.  However she has been on Eliquis long-term, I did not make any changes.  She is tolerating this well.  No bleeding diathesis.  External labs reviewed.  3. S/P MVR (mitral valve repair) 2007 Excellent repair of mitral valve, I reviewed her echocardiogram that was done a year ago and heart sounds are normal on auscultation.  4. Ventricular fibrillation ([place]) arest 1986 S/P ICD placement in 1996 Patient has had recurrent ventricular fibrillation arrest and has had ICD discharge, but fortunately over the past 8 to 10 years, she has not had any further episodes of VF arrest or defibrillator discharge.  Her potassium levels are well-maintained to >4.  5. ICD Dual Chamber Medtronic Cobalt XT dual-chamber ICD in 1996 for VF in 1986 arrest ICD in situ, functioning normally.  I tried to review her chart on Care Everywhere and try to gather as much data as possible.  60-minute office visit encounter in review of charts, review of external records, review of external labs and coordination of care.  Extremely complex patient's presentation.   CC: Norville Haggard, MD (EP)    Adrian Prows, MD, [facility] 07/25/2022, 1:12 PM Office: [phone]

## 2022-08-24 ENCOUNTER — Other Ambulatory Visit: Payer: Self-pay | Admitting: Physician Assistant

## 2022-10-24 ENCOUNTER — Encounter: Payer: Self-pay | Admitting: Cardiology

## 2022-10-24 ENCOUNTER — Ambulatory Visit: Payer: Medicare Other | Admitting: Cardiology

## 2022-10-24 VITALS — BP 118/62 | HR 80 | Resp 16 | Ht 62.0 in | Wt 119.0 lb

## 2022-10-24 DIAGNOSIS — Z95810 Presence of automatic (implantable) cardiac defibrillator: Secondary | ICD-10-CM

## 2022-10-24 DIAGNOSIS — I480 Paroxysmal atrial fibrillation: Secondary | ICD-10-CM

## 2022-10-24 DIAGNOSIS — Z9889 Other specified postprocedural states: Secondary | ICD-10-CM

## 2022-10-24 DIAGNOSIS — I4901 Ventricular fibrillation: Secondary | ICD-10-CM

## 2022-10-24 NOTE — Progress Notes (Signed)
Primary Physician/Referring:  Shon Baton, MD  Patient ID: Nicole Hampton, female    DOB: 1946/01/20, 77 y.o.   MRN: [id number]  Chief Complaint  Patient presents with   Paroxysmal atrial fibrillation [facility])   Follow-up   HPI:    Nicole Hampton  is a 77 y.o.  Caucasian female patient with chronotropic incompetence and sinus node dysfunction, paroxysmal atrial fibrillation and atypical atrial flutter, PSVT SP ablation in the remote past, primary hypertension,  mitral valve repair in 2007 and history of recurrent ventricular fibrillation arrest starting in 1986 and in 1996 and had Medtronic Evera dual-chamber ICD and generator change out in Apr 2023.  Nicole Hampton  She established with me 3 months ago, in view of her complexity presents for routine office visit.  She is presently asymptomatic.  Past Medical History:  Diagnosis Date   Arrhythmia    BCC (basal cell carcinoma)    SEES DERM.   Complication of anesthesia    extremem nausea and vomiting   Hyperlipidemia    WITH ELEVATED LDL/HDL CLEAN CORS 2006. DR. 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 AICD   Vertigo    RECURRENT   Past Surgical History:  Procedure Laterality Date   CARDIOVERSION N/A 02/11/2019   Procedure: CARDIOVERSION;  Surgeon: Elouise Munroe, MD;  Location: [facility] ENDOSCOPY;  Service: Cardiovascular;  Laterality: N/A;   CESAREAN SECTION     x1   ICD GENERATOR CHANGE  05/25/2014   MDT Gwyneth Revels S DR ICD implanted by Dr Rosita Fire (secondary prevention)   MITRAL VALVE REPAIR (MV)/CORONARY ARTERY BYPASS GRAFTING (CABG)  2006   NV ISSUES PROBABLY FROM  RHEUMATIC FEVER   OPEN HEART SURGERY WITH DEFIB  2006   1996   Family History  Problem Relation Age of Onset   Arthritis Mother 64       DROWNING WITH HURRICANE [name]   Hypertension Father 12       DROWNING WITH HURRICANE [name]   ALS Sister    Pancreatic cancer Brother    Macular degeneration Brother    Heart Problems Daughter     Social History   Tobacco Use   Smoking status: Never   Smokeless tobacco: Never  Substance Use Topics   Alcohol use: Not Currently   Marital Status: Married  ROS  Review of Systems  Cardiovascular:  Negative for chest pain, dyspnea on exertion and leg swelling.   Objective      10/24/2022    1:05 PM 07/25/2022   12:37 PM 07/25/2022   12:31 PM  Vitals with BMI  Height 5\' 2"   5' 2.5"  Weight 119 lbs  119 lbs 10 oz  BMI [id number]  99991111  Systolic 123456 123456 123456  Diastolic 62 60 56  Pulse 80 74 79   Physical Exam Neck:     Vascular: No carotid bruit or JVD.  Cardiovascular:     Rate and Rhythm: Normal rate and regular rhythm.     Pulses: Intact distal pulses.  Heart sounds: Murmur heard.     Low-pitched crescendo diastolic murmur is present at the apex.     No gallop.  Pulmonary:     Effort: Pulmonary effort is normal.     Breath sounds: Normal breath sounds.  Abdominal:     General: Bowel sounds are normal.     Palpations: Abdomen is soft.  Musculoskeletal:     Right lower leg: No edema.     Left lower leg: No edema.    Laboratory examination:  External labs:   Labs 12/12/2021:  Serum glucose 27 mg, BUN 15, creatinine 0.7, EGFR 82 mL, potassium 4.9, LFTs normal.  Labs 05/18/2022:  Hb 13.7/HCT 36.1, platelets 220, normal indicis.  Total cholesterol 241, triglycerides 155, HDL 70, LDL 140.  Non-HDL cholesterol 171.  TSH normal at 1.86.  Free T4 normal at 1.53.  Vitamin D30.9.  Radiology:    Cardiac Studies:   GATED SPECT MYO PERF W/LEXISCAN STRESS 1D 01/17/2021 Narrative  The left ventricular ejection fraction is normal  (55-65%).  Nuclear stress EF: 55%.  There was no ST segment deviation noted during stress.  Defect 1: There is a medium defect of moderate severity present in the basal inferior, apical inferior and apex location.  This is a low risk study. Mildly abnormal, low risk stress nuclear study with inferior apical thinning versus small prior infarct.  No ischemia.  Gated ejection fraction 55% with normal wall motion.   Echocardiogram 10/25/2020  1. Basal inferior/inferolateral hypokinesis. Left ventricular ejection  raction, by estimation, is 60 to 65%. The left ventricle has normal  function. There is mild left ventricular hypertrophy. Left ventricular diastolic parameters are indeterminate.   2. Right ventricular systolic function is normal. The right ventricular size is normal.   3. Left atrial size was mildly dilated.   4. S/p MV repair (Simplicity annuloplasty band, 08/15/05) Peak and mean  gradients through the valve are 27 and 11 mm Hg respectively. Mild mitral stenosis. No significant change from echo done in 12/09/18. The mitral  valve has been repaired/replaced. Mild   mitral valve regurgitation.   5. The inferior vena cava is normal in size with <50% respiratory variability, suggesting right atrial pressure of 8 mmHg. No significant change in mitral valve peak and mean gradient of 24 and 11 mmHg compared to 12/09/2018  EKG:   EKG 10/24/2022: Atrially paced rhythm with first-degree AV block at rate of 74 bpm, ventricular sensed rhythm.  No evidence of ischemia.  Compared to 07/25/2022, no significant change.  Medications and allergies   Allergies  Allergen Reactions   Cefuroxime Axetil Palpitations   Codeine Nausea And Vomiting and Other (See Comments)    Gi Upset    Morphine Palpitations and Rash   Quinidine Gluconate Other (See Comments)    Positive ANA, tachycardia   Amoxicillin-Pot Clavulanate     Other Reaction(s): GI intolerance only   Other Nausea And Vomiting    general  anesthesia - severe nausea and vomiting  Other Reaction(s): causes Long QT   Echinacea Nausea And Vomiting   Latex Rash     Current Outpatient Medications:    aMILoride (MIDAMOR) 5 MG tablet, Take 5 mg by mouth daily., Disp: , Rfl:    Ascorbic Acid (VITAMIN C PO), Take 1 tablet by mouth daily., Disp: , Rfl:    Calcium Carbonate-Vitamin D (CALTRATE 600+D PO), Take 3 tablets by mouth daily. , Disp: , Rfl:    dofetilide (TIKOSYN) 250 MCG capsule, Take 250 mcg by  mouth 2 (two) times daily., Disp: , Rfl:    ELIQUIS 5 MG TABS tablet, TAKE ONE TABLET TWICE DAILY, Disp: 60 tablet, Rfl: 6   estradiol (ESTRACE) 2 MG tablet, Take 1 mg by mouth at bedtime., Disp: , Rfl:    ezetimibe (ZETIA) 10 MG tablet, Take 10 mg by mouth daily., Disp: , Rfl:    fexofenadine (ALLEGRA) 180 MG tablet, Take 180 mg by mouth daily as needed for allergies or rhinitis., Disp: , Rfl:    furosemide (LASIX) 20 MG tablet, TAKE ONE-HALF TABLET BY MOUTH THREE TIMES A WEEK (Patient taking differently: Take 20 mg by mouth as needed.), Disp: 15 tablet, Rfl: 11   levothyroxine (SYNTHROID) 25 MCG tablet, Take 25 mcg by mouth daily before breakfast., Disp: , Rfl:    Magnesium Cl-Calcium Carbonate (SLOW-MAG PO), Take 1 tablet by mouth daily., Disp: , Rfl:    Multiple Vitamin (MULTIVITAMIN) tablet, Take 1 tablet by mouth once a week. , Disp: , Rfl:    omega-3 acid ethyl esters (LOVAZA) 1 g capsule, Take 1 g by mouth daily. , Disp: , Rfl:    potassium chloride (MICRO-K) 10 MEQ CR capsule, Take 4 capsules (40 mEq total) by mouth 2 (two) times daily. On day of Lasix take one tablet by mouth extra ( 10 meq) X 3 weekly., Disp: 240 capsule, Rfl: 11   azelastine (ASTELIN) 0.1 % nasal spray, Place 1 spray into both nostrils 2 (two) times daily as needed for rhinitis. Use in each nostril as directed (Patient not taking: Reported on 10/24/2022), Disp: , Rfl:    fluticasone (FLONASE) 50 MCG/ACT nasal spray, Place 2 sprays into both nostrils daily as  needed for allergies.  (Patient not taking: Reported on 10/24/2022), Disp: , Rfl:    meclizine (ANTIVERT) 12.5 MG tablet, Take 12.5 mg by mouth 3 (three) times daily as needed for dizziness. (Patient not taking: Reported on 10/24/2022), Disp: , Rfl:    Assessment     ICD-10-CM   1. Paroxysmal atrial fibrillation  I48.0 EKG 12-Lead    2. S/P MVR (mitral valve repair) 2007  Z98.890     3. Ventricular fibrillation ([place]) arest 1986 S/P ICD placement in 1996  I49.01     4. ICD Dual Chamber Medtronic Cobalt XT dual-chamber ICD in 1996 for VF in 1986 arrest  Z95.810       CHA2DS2-VASc Score is 2.  Yearly risk of stroke: 2.3% (F, Age).  Score of 1=0.6; 2=2.2; 3=3.2; 4=4.8; 5=7.2; 6=9.8; 7=>9.8) -(CHF; HTN; vasc disease DM,  Female = 1; Age <65 =0; 65-74 = 1,  >75 =2; stroke/embolism= 2).   Orders Placed This Encounter  Procedures   EKG 12-Lead   No orders of the defined types were placed in this encounter.  Medications Discontinued During This Encounter  Medication Reason   Magnesium 250 MG TABS      Recommendations:   MAGARET LAHMAN is a 77 y.o.  Caucasian female patient with chronotropic incompetence and sinus node dysfunction, paroxysmal atrial fibrillation and atypical atrial flutter, PSVT SP ablation in the remote past, primary hypertension,  mitral valve repair in 2007 and history of recurrent ventricular fibrillation arrest starting in 1986 and in 1996 and had Medtronic Evera dual-chamber ICD and generator change out in Apr 2023.  This is a 24-month office visit.  Remains asymptomatic.  1. Paroxysmal atrial fibrillation Patient continues to maintain sinus rhythm fortunately.  She is on dofetilide and also on Eliquis, continue the same. - EKG 12-Lead  2. S/P MVR (mitral valve repair) 2007 She is status post mitral valve repair in 2007, I went back and again reviewed her images and also looked at the gradients across the mitral valve, echocardiogram done in 2020 and also again in 2022  reveals no change in mitral pressure gradient.  I do hear mitral valve stenotic diastolic murmur which is very soft.  3. Ventricular fibrillation ([place]) arest 1986 S/P ICD placement in 1996 I have requested [facility] to release the ICD for my evaluation.  She has not had any therapy.  4. ICD Dual Chamber Medtronic Cobalt XT dual-chamber ICD in 1996 for VF in 1986 arrest As dictated above, patient has dual-chamber ICD, will take over her monitoring.  Otherwise she remains very stable from cardiac standpoint, I will see her back in a year.    Adrian Prows, MD, [facility] 10/24/2022, 1:43 PM Office: [phone]

## 2022-11-15 ENCOUNTER — Ambulatory Visit: Payer: Medicare Other | Admitting: Podiatry

## 2022-11-15 ENCOUNTER — Encounter: Payer: Self-pay | Admitting: Podiatry

## 2022-11-15 ENCOUNTER — Ambulatory Visit (INDEPENDENT_AMBULATORY_CARE_PROVIDER_SITE_OTHER): Payer: Medicare Other

## 2022-11-15 DIAGNOSIS — M2042 Other hammer toe(s) (acquired), left foot: Secondary | ICD-10-CM

## 2022-11-15 DIAGNOSIS — M722 Plantar fascial fibromatosis: Secondary | ICD-10-CM | POA: Diagnosis not present

## 2022-11-15 DIAGNOSIS — M2041 Other hammer toe(s) (acquired), right foot: Secondary | ICD-10-CM | POA: Diagnosis not present

## 2022-11-15 DIAGNOSIS — M21619 Bunion of unspecified foot: Secondary | ICD-10-CM

## 2022-11-15 NOTE — Progress Notes (Signed)
Subjective:   Patient ID: Nicole Hampton, female   DOB: 77 y.o.   MRN: [id number]   HPI Patient states in general her feet are doing better with her new orthotics moderate still pain at times in the arch heel with structural bunion deformity with pain between the big toe second toe left foot that is not severe present but bothers   ROS      Objective:  Physical Exam  Neurovascular status intact 3 separate problems with 1 being moderate chronic arch deformity with structural bunion deformity bilateral digital deformity pressure between the big toe second toe bilateral     Assessment:  Planter fasciitis bilateral with significant improvement with orthotics bunion deformity digital deformity in her digital pain left     Plan:  H&P reviewed and went ahead today padded the left big toe advised on wider shoes continue orthotics patient to be seen back as needed with all questions answered today concerning all 3 conditions

## 2023-01-21 ENCOUNTER — Encounter: Payer: Self-pay | Admitting: Cardiology

## 2023-05-03 ENCOUNTER — Telehealth: Payer: Self-pay

## 2023-05-03 NOTE — Telephone Encounter (Signed)
Following alert received from [employer] received for monitored VT 10/10 @ 09:52, duration 47sec, HR 136.  Patient reports she was up moving around but denies any symptoms. Reports compliance with tikosyn 250 mg BID. Patient was not aware Dr. Nadara Eaton would no longer monitor her ICD and see her. Pt states she will call Dr. Jodi Marble staff to inquire further then call back once she has her information provided to make apt with Dr. Elberta Fortis.   I explained to patient Dr. Nadara Eaton is now working at [facility] in our building, although no longer managing devices. Advised patient Dr. Elberta Fortis is happy to see her. Patient voiced understanding and states she will call back.

## 2023-05-04 NOTE — Telephone Encounter (Signed)
Patient called to advise she is going to call [facility] to let them know we will be seeing her in the EP department and she will continue to see Dr. Nadara Eaton as a gen cards. Routing to Dr. Elberta Fortis as Lorain Childes.

## 2023-05-04 NOTE — Telephone Encounter (Signed)
Pt is requesting a callback from nurse regarding needing clarity on previous conversation that was had regarding note below. Please advise.  Following alert received from [employer] received for monitored VT 10/10 @ 09:52, duration 47sec, HR 136.   Patient reports she was up moving around but denies any symptoms. Reports compliance with tikosyn 250 mg BID. Patient was not aware Dr. Nadara Eaton would no longer monitor her ICD and see her. Pt states she will call Dr. Jodi Marble staff to inquire further then call back once she has her information provided to make apt with Dr. Elberta Fortis.    I explained to patient Dr. Nadara Eaton is now working at [facility] in our building, although no longer managing devices. Advised patient Dr. Elberta Fortis is happy to see her. Patient voiced understanding and states she will call back.

## 2023-05-19 IMAGING — CT CT CHEST HIGH RESOLUTION W/O CM
2 of 7 series · 14 of 36 positions shown, 17 images · non-contrast
Comparison: None.

CLINICAL DATA: Dyspnea on exertion

EXAM:
CT CHEST WITHOUT CONTRAST
TECHNIQUE: Multidetector CT imaging of the chest was performed following the
standard protocol without intravenous contrast. High resolution
imaging of the lungs, as well as inspiratory and expiratory imaging,
was performed.

[Series 6: high resolution · axial · 0.61mm/px · z∈[-288,-36]mm · 11 of 304 slices shown, 14 images]
[im 26/304  mediastinal]
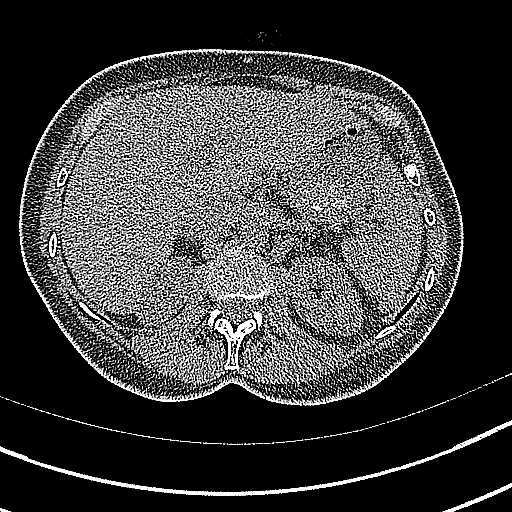
[im 26/304  lung]
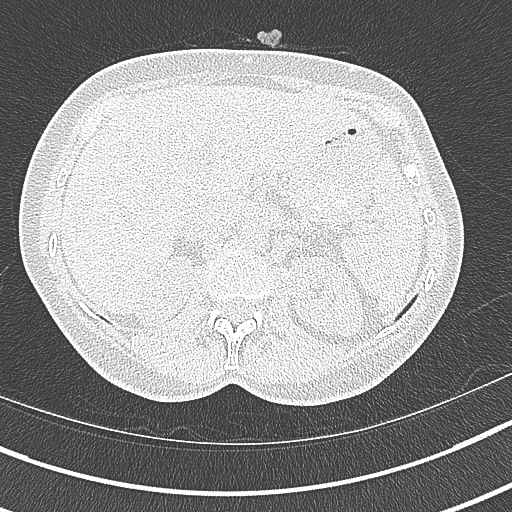
[im 51/304  lung]
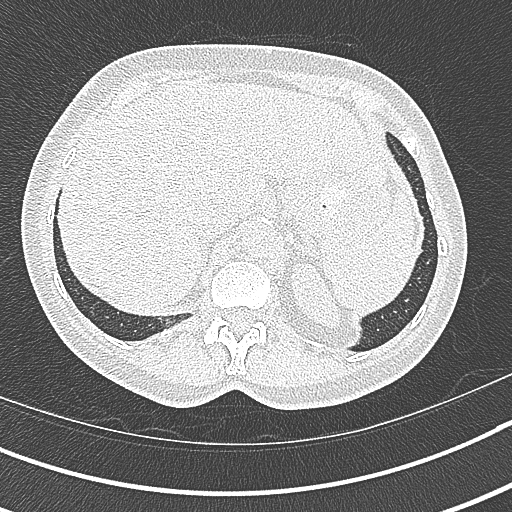
[im 76/304  lung]
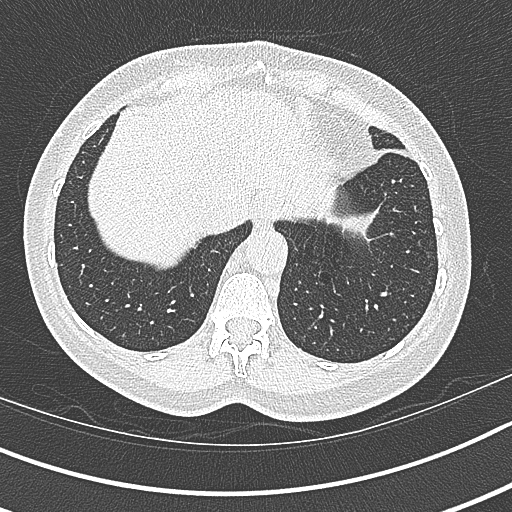
[im 102/304  lung]
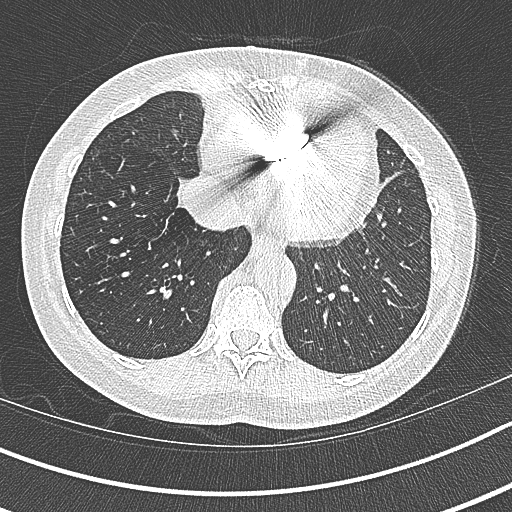
[im 127/304  mediastinal]
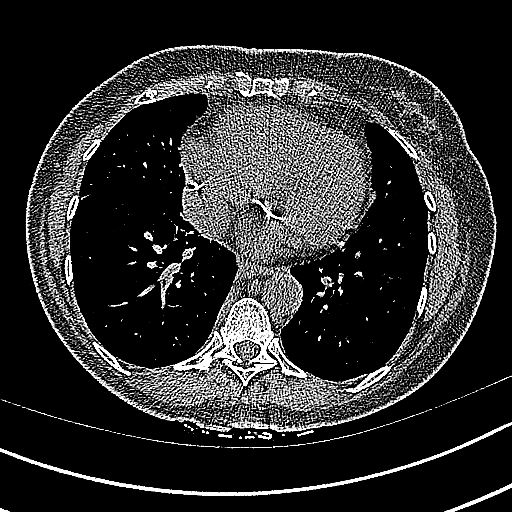
[im 127/304  lung]
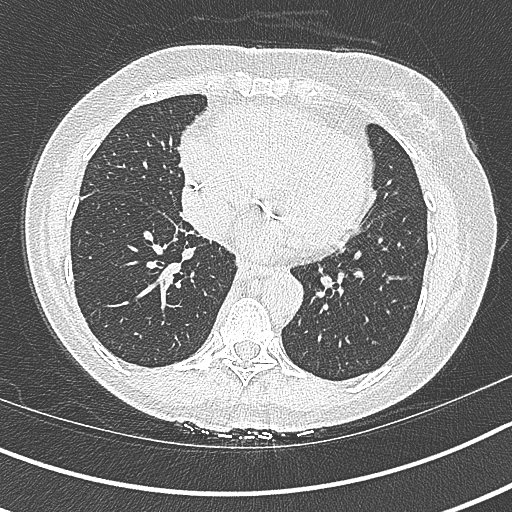
[im 152/304  lung]
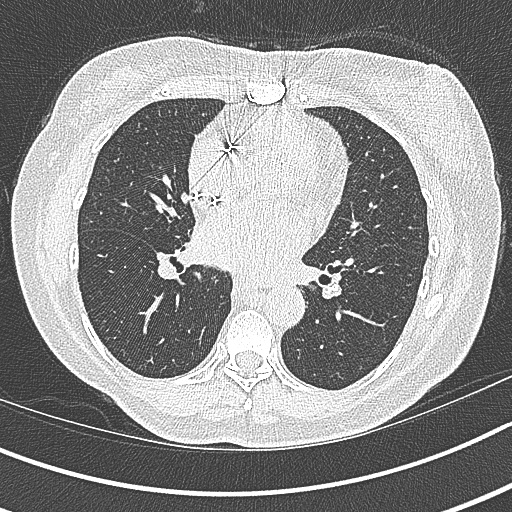
[im 177/304  lung]
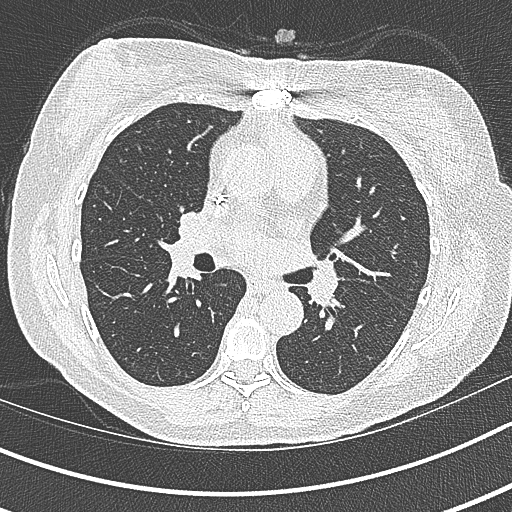
[im 203/304  lung]
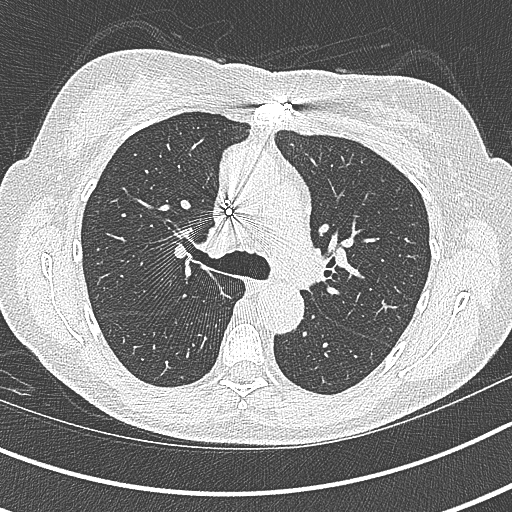
[im 228/304  mediastinal]
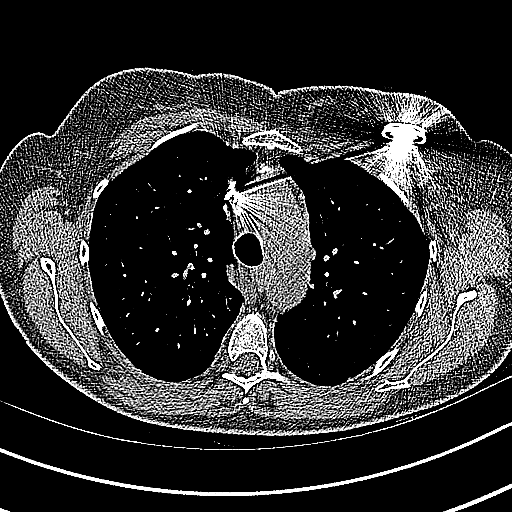
[im 228/304  lung]
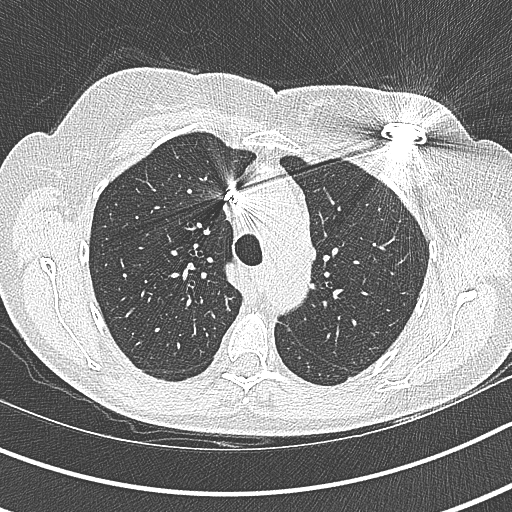
[im 253/304  lung]
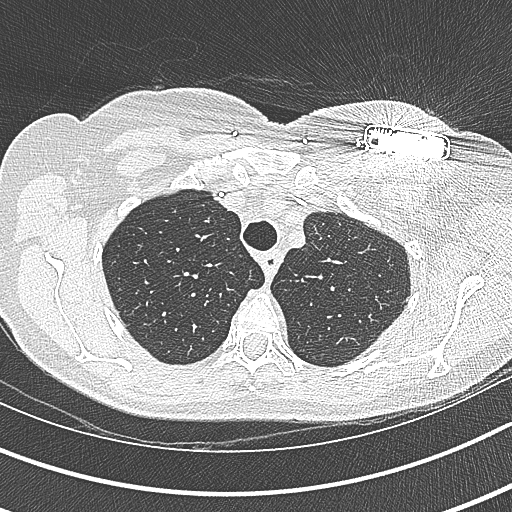
[im 278/304  lung]
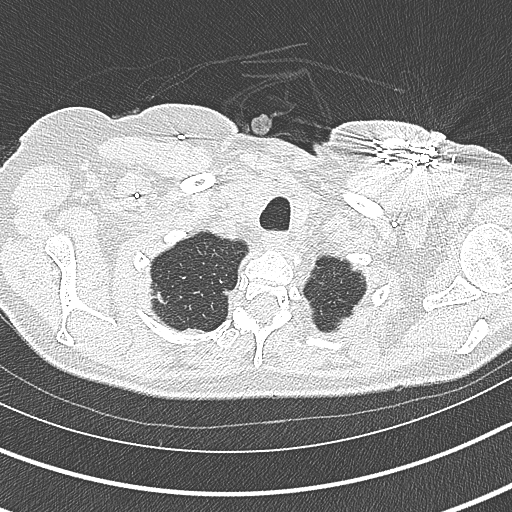

[Series 8: coronal · coronal · 0.61mm/px · 3 of 128 slices shown]
[im 26/128  lung]
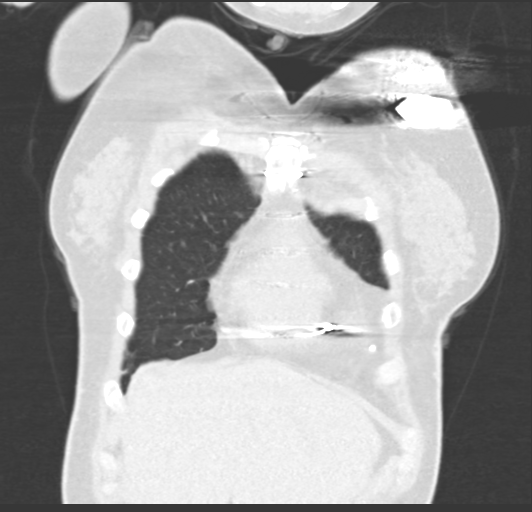
[im 51/128  lung]
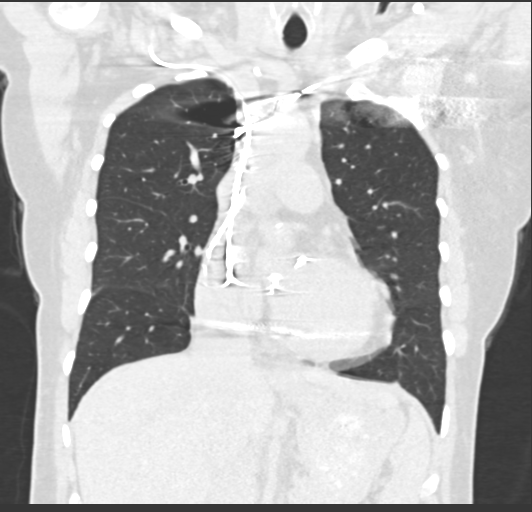
[im 77/128  lung]
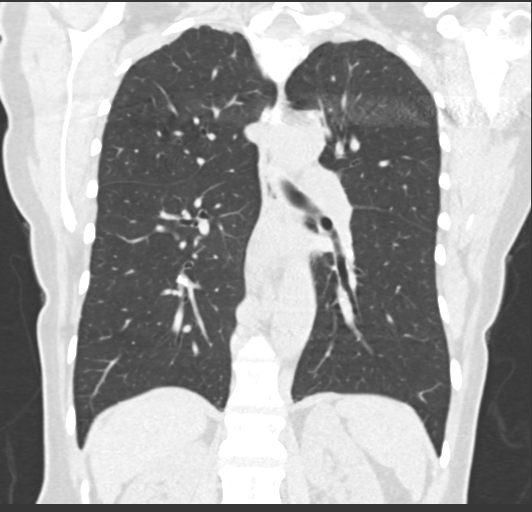

[14 of 36 positions shown; findings below may reference images not displayed]

FINDINGS: Cardiovascular: Mild cardiomegaly. No pericardial effusion few prior
mitral valve repair. Left chest wall AICD with leads in the right
atrium and right ventricle. No significant coronary artery
calcifications.

Mediastinum/Nodes: Serpiginous right retro crural structure, likely
due to prominent azygos vein. No pathologically enlarged lymph nodes
seen in the chest. Esophagus and thyroid are unremarkable.

Lungs/Pleura: Central airways are patent. No consolidation, pleural
effusion or pneumothorax. Follow-up pulmonary nodule of the right
lower lobe measuring 4 mm located on series 6, image 161. No
evidence of air trapping.

Upper Abdomen: No acute abnormality.

Musculoskeletal: Sternotomy wires appear aligned and intact. No
chest wall mass or suspicious bone lesions identified.
IMPRESSION: No lung parenchymal findings to explain shortness of breath.

Solid 4 mm pulmonary nodule the right lower lobe. No follow-up
needed if patient is low-risk. Non-contrast chest CT can be
considered in 12 months if patient is high-risk. This recommendation
follows the consensus statement: Guidelines for Management of
Incidental Pulmonary Nodules Detected on CT Images: From the

## 2023-05-29 NOTE — Progress Notes (Signed)
Office Visit Note  Patient: Nicole Hampton             Date of Birth: [birth year]           MRN: [id number]             PCP: Creola Corn, MD Referring: Creola Corn, MD Visit Date: 06/07/2023 Occupation: @GUAROCC @  Subjective:  Pain in both hands  History of Present Illness: Nicole Hampton is a 77 y.o. female  with osteoarthritis and Raynaud's phenomenon.  She returns today after her last visit in October 2022.  At that time clinical findings were consistent with osteoarthritis.  She was also referred to occupational therapy.  Patient states she did not go for occupational therapy.  She has known history of osteoarthritis in her feet.  She states the arthritis in her hands is getting worse over time.  She also has ongoing discomfort in her feet.  Her Raynauds is worse during the winter months.  Currently is not very bothersome.  She is also noticed a rash on her extremities and back of her hands.  She was seen by the dermatologist in [place].  She was told it was contact dermatitis.  She has tried changing products but did not make any difference.  She was also told to use Vaseline.  Patient is concerned if she may have psoriasis.   Activities of Daily Living:  Patient reports morning stiffness for 0 minutes.   Patient Reports nocturnal pain.  Difficulty dressing/grooming: Denies Difficulty climbing stairs: Denies Difficulty getting out of chair: Denies Difficulty using hands for taps, buttons, cutlery, and/or writing: Reports  Review of Systems  Constitutional:  Negative for fatigue.  HENT:  Negative for mouth sores and mouth dryness.   Eyes:  Negative for dryness.  Respiratory:  Negative for cough, shortness of breath and wheezing.   Cardiovascular:  Positive for chest pain and palpitations.       Followed by cardiologist   Gastrointestinal:  Negative for blood in stool, constipation and diarrhea.  Endocrine: Negative for increased urination.  Genitourinary:  Negative for  involuntary urination.  Musculoskeletal:  Positive for joint pain, joint pain, joint swelling, myalgias, muscle weakness, muscle tenderness and myalgias. Negative for gait problem and morning stiffness.  Skin:  Positive for rash and sensitivity to sunlight. Negative for color change and hair loss.  Allergic/Immunologic: Negative for susceptible to infections.  Neurological:  Negative for dizziness and headaches.  Hematological:  Negative for swollen glands.  Psychiatric/Behavioral:  Negative for depressed mood and sleep disturbance. The patient is not nervous/anxious.     PMFS History:  Patient Active Problem List   Diagnosis Date Noted   ICD Dual Chamber Medtronic Evera dual-chamber ICD in 1996 for VF in 1986 arrest 07/25/2022   Ventricular fibrillation (HCC) arest 1986 S/P ICD placement in 1996 07/25/2022   S/P MVR (mitral valve repair) 2007 07/25/2022   Sinus node dysfunction (HCC) 07/25/2022   DOE (dyspnea on exertion) 04/07/2021   Atypical atrial flutter (HCC) 08/12/2019   Secondary hypercoagulable state (HCC) 08/12/2019   Atrial fibrillation (HCC)     Past Medical History:  Diagnosis Date   Arrhythmia    BCC (basal cell carcinoma)    SEES DERM.   Complication of anesthesia    extremem nausea and vomiting   Hyperlipidemia    WITH ELEVATED LDL/HDL CLEAN CORS 2006. DR. Alinda Money SIMMONS   Hypertension    Hypokalemia    CHRONIC   Hypothyroidism    Lactose intolerance  OA (osteoarthritis)    HANDS AND FEET   Pancreatitis 1982   GB   Persistent atrial fibrillation (HCC)    also typical atrial flutter; she reports prior ablations with Dr Sharol Harness, no records available   Rheumatic fever    as a child   S/P cholecystectomy    OPEN   Sudden cardiac death (HCC) 1986   RELATED TO SEVERE HYPOKALEMIA. AICD   Vertigo    RECURRENT    Family History  Problem Relation Age of Onset   Arthritis Mother 46       DROWNING WITH HURRICANE [name]   Hypertension Father 19       DROWNING  WITH HURRICANE [name]   ALS Sister    Pancreatic cancer Brother    Macular degeneration Brother    Heart Problems Daughter    Past Surgical History:  Procedure Laterality Date   CARDIAC DEFIBRILLATOR PLACEMENT  2022   CARDIOVERSION N/A 02/11/2019   Procedure: CARDIOVERSION;  Surgeon: Parke Poisson, MD;  Location: [facility];  Service: Cardiovascular;  Laterality: N/A;   CESAREAN SECTION     x1   ICD GENERATOR CHANGE  05/25/2014   MDT Melvyn Neth S DR ICD implanted by Dr Sharol Harness (secondary prevention)   MITRAL VALVE REPAIR (MV)/CORONARY ARTERY BYPASS GRAFTING (CABG)  2006   NV ISSUES PROBABLY FROM RHEUMATIC FEVER   OPEN HEART SURGERY WITH DEFIB  2006   1996   Social History   Social History Narrative   Not on file   Immunization History  Administered Date(s) Administered   PFIZER(Purple Top)SARS-COV-2 Vaccination 09/18/2019, 10/14/2019, 04/08/2020     Objective: Vital Signs: BP 129/69 (BP Location: Right Arm, Patient Position: Sitting, Cuff Size: Normal)   Pulse 76   Resp 13   Ht 5\' 2"  (1.575 m)   Wt 119 lb 6.4 oz (54.2 kg)   BMI 21.84 kg/m    Physical Exam Vitals and nursing note reviewed.  Constitutional:      Appearance: She is well-developed.  HENT:     Head: Normocephalic and atraumatic.  Eyes:     Conjunctiva/sclera: Conjunctivae normal.  Cardiovascular:     Rate and Rhythm: Normal rate and regular rhythm.     Heart sounds: Normal heart sounds.  Pulmonary:     Effort: Pulmonary effort is normal.     Breath sounds: Normal breath sounds.  Abdominal:     General: Bowel sounds are normal.     Palpations: Abdomen is soft.  Musculoskeletal:     Cervical back: Normal range of motion.  Lymphadenopathy:     Cervical: No cervical adenopathy.  Skin:    General: Skin is warm and dry.     Capillary Refill: Capillary refill takes less than 2 seconds.     Comments: Erythematous macular lesions were noted over bilateral forearms and dorsum of her hands.   Neurological:     Mental Status: She is alert and oriented to person, place, and time.  Psychiatric:        Behavior: Behavior normal.      Musculoskeletal Exam: Cervical, thoracic and lumbar spine were in good range of motion.  Shoulder joints, elbow joints, wrist joints with good range of motion.  She bilateral CMC PIP and DIP thickening.  Subluxation of her right index finger DIP was noted.  No synovitis was noted.  Hip joints and knee joints were in good range of motion without any warmth swelling or effusion.  There was no tenderness over ankles or MTPs.  CDAI Exam: CDAI Score: -- Patient Global: --; Provider Global: -- Swollen: --; Tender: -- Joint Exam 06/07/2023   No joint exam has been documented for this visit   There is currently no information documented on the homunculus. Go to the Rheumatology activity and complete the homunculus joint exam.  Investigation: No additional findings.  Imaging: No results found.  Recent Labs: Lab Results  Component Value Date   WBC 4.7 09/30/2020   HGB 13.4 09/30/2020   PLT 271 09/30/2020   NA 140 09/30/2020   K 5.0 09/30/2020   CL 103 09/30/2020   CO2 20 09/30/2020   GLUCOSE 86 09/30/2020   BUN 19 09/30/2020   CREATININE 0.82 09/30/2020   CALCIUM 9.2 09/30/2020   GFRAA >60 01/29/2019    Speciality Comments: No specialty comments available.  Procedures:  No procedures performed Allergies: Cefuroxime axetil, Codeine, Morphine, Quinidine gluconate, Amoxicillin-pot clavulanate, Other, Echinacea, and Latex   Assessment / Plan:     Visit Diagnoses: Primary osteoarthritis of both hands-patient complains of increased pain and discomfort in her hands.  She states she did not go for occupational therapy after the last visit.  She would like to go for occupational therapy.  She complains of discomfort in the bilateral CMC PIP and DIP joints.  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  She has been also using  Voltaren gel as needed.  She has been working out on a regular basis.  She had no synovitis on the examination.  X-rays from 2022 were reviewed which showed severe CMC narrowing and PIP and DIP narrowing.  Some of the MCPs were also narrow.  Patient does she is very active and does gardening and yard work.  Erosive changes noted in the right first DIP joint.  Joint protection muscle strengthening was discussed.  A handout on hand exercises was given.  I will also refer to occupational therapy.  Use of compression close and hand massager was also discussed.  Primary osteoarthritis of both feet-she has intermittent discomfort in her feet.  She has modified her shoes.  She denies any discomfort today.  Raynaud's disease without gangrene-she has been symptoms during the winter months.  Symptoms manageable with warm clothing.  Keeping core temperature warm was also discussed.  Rash-Raynauds macular lesions noted on the dorsal aspect of bilateral forearms and her hands.  Patient is seen a dermatologist at [facility] and was told to use moisturizer.  Patient wanted to know if these findings were consistent with psoriasis.  I advised that the rash is not typical for psoriasis.  Other medical problems are listed as follows:  Atypical atrial flutter (HCC)  S/P MVR (mitral valve repair) 2007  Secondary hypercoagulable state [facility])  Ventricular fibrillation (HCC) arest 1986 S/P ICD placement in 1996  Orders: Orders Placed This Encounter  Procedures   Ambulatory referral to Occupational Therapy   No orders of the defined types were placed in this encounter.    Follow-Up Instructions: Return if symptoms worsen or fail to improve, for Osteoarthritis.   Pollyann Savoy, MD  Note - This record has been created using Animal nutritionist.  Chart creation errors have been sought, but may not always  have been located. Such creation errors do not reflect on  the standard of medical care.

## 2023-06-07 ENCOUNTER — Ambulatory Visit: Payer: Medicare Other | Attending: Rheumatology | Admitting: Rheumatology

## 2023-06-07 ENCOUNTER — Encounter: Payer: Self-pay | Admitting: Rheumatology

## 2023-06-07 VITALS — BP 129/69 | HR 76 | Resp 13 | Ht 62.0 in | Wt 119.4 lb

## 2023-06-07 DIAGNOSIS — M19041 Primary osteoarthritis, right hand: Secondary | ICD-10-CM

## 2023-06-07 DIAGNOSIS — I4901 Ventricular fibrillation: Secondary | ICD-10-CM

## 2023-06-07 DIAGNOSIS — M19071 Primary osteoarthritis, right ankle and foot: Secondary | ICD-10-CM | POA: Diagnosis not present

## 2023-06-07 DIAGNOSIS — M19042 Primary osteoarthritis, left hand: Secondary | ICD-10-CM | POA: Diagnosis not present

## 2023-06-07 DIAGNOSIS — D6869 Other thrombophilia: Secondary | ICD-10-CM

## 2023-06-07 DIAGNOSIS — M19072 Primary osteoarthritis, left ankle and foot: Secondary | ICD-10-CM

## 2023-06-07 DIAGNOSIS — I484 Atypical atrial flutter: Secondary | ICD-10-CM

## 2023-06-07 DIAGNOSIS — R21 Rash and other nonspecific skin eruption: Secondary | ICD-10-CM | POA: Diagnosis not present

## 2023-06-07 DIAGNOSIS — Z9889 Other specified postprocedural states: Secondary | ICD-10-CM

## 2023-06-07 DIAGNOSIS — I7300 Raynaud's syndrome without gangrene: Secondary | ICD-10-CM

## 2023-06-07 NOTE — Patient Instructions (Signed)

## 2023-06-12 ENCOUNTER — Telehealth: Payer: Self-pay | Admitting: Rheumatology

## 2023-06-12 NOTE — Telephone Encounter (Signed)
Patient called stating she was returning Marissa's call regarding her referral to the [facility].  Patient states she has been to [facility] for her shoulder in the past, but not sure if they specialize in hands.  Patient requested a return call to discuss other clinics.

## 2023-06-13 NOTE — Telephone Encounter (Signed)
We can refer her to [facility] orthopedics.

## 2023-06-13 NOTE — Telephone Encounter (Signed)
Referral has been faxed and patient has been advised.

## 2023-06-13 NOTE — Telephone Encounter (Signed)
[facility] is not taking an outside referrals right now and for the foreseeable future. I advised patient we send referrals to numerous locations for OT. Patient would like to know your recommendation for OT for the hands. She has been a previous patient at [facility] for her shoulder. Please advise.

## 2023-07-04 ENCOUNTER — Telehealth: Payer: Self-pay | Admitting: Cardiology

## 2023-07-04 DIAGNOSIS — I484 Atypical atrial flutter: Secondary | ICD-10-CM

## 2023-07-04 DIAGNOSIS — I495 Sick sinus syndrome: Secondary | ICD-10-CM

## 2023-07-04 DIAGNOSIS — I4901 Ventricular fibrillation: Secondary | ICD-10-CM

## 2023-07-04 DIAGNOSIS — I4891 Unspecified atrial fibrillation: Secondary | ICD-10-CM

## 2023-07-04 DIAGNOSIS — Z95810 Presence of automatic (implantable) cardiac defibrillator: Secondary | ICD-10-CM

## 2023-07-04 NOTE — Telephone Encounter (Signed)
I would want her to establish with our practice as I am a part of the practice as well. She can choose who she wants to see (EP).

## 2023-07-04 NOTE — Telephone Encounter (Signed)
Patient calling to speak with Dr. Jacinto Halim to see if she needs a local EP dr. Please advise

## 2023-07-04 NOTE — Telephone Encounter (Signed)
Spoke with patient and she states she is a defibrillator patient and she states her EP provider is retiring at [facility]. She would really like Dr. Verl Dicker input on if she should stay with [facility] or transfer to EP her in office?

## 2023-07-05 ENCOUNTER — Encounter: Payer: Self-pay | Admitting: Cardiology

## 2023-07-05 NOTE — Telephone Encounter (Signed)
Patient notified.  She would like EP scheduler to call her to schedule appointment.  EP referral placed

## 2023-07-10 ENCOUNTER — Ambulatory Visit (INDEPENDENT_AMBULATORY_CARE_PROVIDER_SITE_OTHER): Payer: Medicare Other

## 2023-07-10 DIAGNOSIS — I4901 Ventricular fibrillation: Secondary | ICD-10-CM

## 2023-07-11 LAB — CUP PACEART REMOTE DEVICE CHECK
Battery Remaining Longevity: 114 mo
Battery Voltage: 3 V
Brady Statistic RV Percent Paced: 3.52 %
Date Time Interrogation Session: 20241216233230
HighPow Impedance: 34 Ohm
HighPow Impedance: 47 Ohm
Lead Channel Impedance Value: 399 Ohm
Lead Channel Impedance Value: 665 Ohm
Lead Channel Impedance Value: 722 Ohm
Lead Channel Pacing Threshold Amplitude: 0.75 V
Lead Channel Pacing Threshold Pulse Width: 0.4 ms
Lead Channel Sensing Intrinsic Amplitude: 0.4 mV
Lead Channel Sensing Intrinsic Amplitude: 3.8 mV
Lead Channel Setting Pacing Amplitude: 1.5 V
Lead Channel Setting Pacing Amplitude: 3.5 V
Lead Channel Setting Pacing Pulse Width: 1 ms
Lead Channel Setting Sensing Sensitivity: 0.45 mV
Zone Setting Status: 755011
Zone Setting Status: 755011

## 2023-07-27 ENCOUNTER — Ambulatory Visit: Payer: Medicare Other | Attending: Internal Medicine | Admitting: Internal Medicine

## 2023-07-27 ENCOUNTER — Encounter: Payer: Self-pay | Admitting: Internal Medicine

## 2023-07-27 VITALS — BP 124/76 | HR 68 | Ht 62.0 in | Wt 118.0 lb

## 2023-07-27 DIAGNOSIS — I4901 Ventricular fibrillation: Secondary | ICD-10-CM | POA: Diagnosis not present

## 2023-07-27 DIAGNOSIS — Z95810 Presence of automatic (implantable) cardiac defibrillator: Secondary | ICD-10-CM | POA: Diagnosis not present

## 2023-07-27 DIAGNOSIS — I480 Paroxysmal atrial fibrillation: Secondary | ICD-10-CM

## 2023-07-27 LAB — CUP PACEART INCLINIC DEVICE CHECK
Date Time Interrogation Session: 20250103133448
Implantable Lead Connection Status: 753985
Implantable Lead Connection Status: 753985
Implantable Lead Implant Date: 20030319
Implantable Lead Implant Date: 20141103
Implantable Lead Location: 753859
Implantable Lead Location: 753860
Implantable Lead Model: 5076
Implantable Lead Model: 6947
Implantable Pulse Generator Implant Date: 20230405

## 2023-07-27 NOTE — Progress Notes (Signed)
 Patient Care Team: Onita Rush, MD as PCP - General (Internal Medicine) Ladona Heinz, MD as Consulting Physician (Cardiology)   HPI  Nicole Hampton is a 78 y.o. female seen in follow-up for an ICD implanted at [facility] 2014 for secondary prevention with generator replacement at [facility] 2023 and interval lead revision w fracture resulting in inappropriate shocks.  Interval follow-up with Dr. Kelsie  but long term relationship with Dr ONEIDA Shed at [facility]  History valvular heart disease with mitral valve repair 2007 and CABG ( details not available) with atrial arrhythmias with cardioversion  Remains very active  The patient denies chest pain, shortness of breath, nocturnal dyspnea, orthopnea or peripheral edema.  There have been no palpitations, lightheadedness or syncope.       DATE TEST EF   4//22 Echo   60-65 % Infinf-lat HK  6/22 Myoview   55-65% No perfusion defects               Records and Results Reviewed   Past Medical History:  Diagnosis Date   Arrhythmia    BCC (basal cell carcinoma)    SEES DERM.   Complication of anesthesia    extremem nausea and vomiting   Hyperlipidemia    WITH ELEVATED LDL/HDL CLEAN CORS 2006. DR. KOREN SIMMONS   Hypertension    Hypokalemia    CHRONIC   Hypothyroidism    Lactose intolerance    OA (osteoarthritis)    HANDS AND FEET   Pancreatitis 1982   GB   Persistent atrial fibrillation (HCC)    also typical atrial flutter; she reports prior ablations with Dr Shed, no records available   Rheumatic fever    as a child   S/P cholecystectomy    OPEN   Sudden cardiac death (HCC) 1986   RELATED TO SEVERE HYPOKALEMIA. AICD   Vertigo    RECURRENT    Past Surgical History:  Procedure Laterality Date   CARDIAC DEFIBRILLATOR PLACEMENT  2022   CARDIOVERSION N/A 02/11/2019   Procedure: CARDIOVERSION;  Surgeon: Loni Soyla LABOR, MD;  Location: [facility] ENDOSCOPY;  Service: Cardiovascular;  Laterality: N/A;   CESAREAN SECTION      x1   ICD GENERATOR CHANGE  05/25/2014   MDT Sylvan S DR ICD implanted by Dr Shed (secondary prevention)   MITRAL VALVE REPAIR (MV)/CORONARY ARTERY BYPASS GRAFTING (CABG)  2006   NV ISSUES PROBABLY FROM RHEUMATIC FEVER   OPEN HEART SURGERY WITH DEFIB  2006   1996    Current Meds  Medication Sig   aMILoride (MIDAMOR) 5 MG tablet Take 5 mg by mouth daily.   Ascorbic Acid (VITAMIN C PO) Take 1 tablet by mouth daily.   azelastine (ASTELIN) 0.1 % nasal spray Place 1 spray into both nostrils 2 (two) times daily as needed for rhinitis. Use in each nostril as directed   Calcium Carbonate-Vitamin D (CALTRATE 600+D PO) Take 3 tablets by mouth daily.    dofetilide (TIKOSYN) 250 MCG capsule Take 250 mcg by mouth 2 (two) times daily.   ELIQUIS  5 MG TABS tablet TAKE ONE TABLET TWICE DAILY   estradiol (ESTRACE) 2 MG tablet Take 1 mg by mouth at bedtime.   ezetimibe (ZETIA) 10 MG tablet Take 10 mg by mouth daily.   fexofenadine (ALLEGRA) 180 MG tablet Take 180 mg by mouth daily as needed for allergies or rhinitis.   fluticasone (FLONASE) 50 MCG/ACT nasal spray Place 2 sprays into both nostrils daily as needed for allergies.  furosemide  (LASIX ) 20 MG tablet TAKE ONE-HALF TABLET BY MOUTH THREE TIMES A WEEK (Patient taking differently: Take 20 mg by mouth as needed.)   levothyroxine (SYNTHROID) 25 MCG tablet Take 25 mcg by mouth daily before breakfast.   Magnesium Cl-Calcium Carbonate (SLOW-MAG PO) Take 1 tablet by mouth daily.   meclizine (ANTIVERT) 12.5 MG tablet Take 12.5 mg by mouth 3 (three) times daily as needed for dizziness.   Multiple Vitamin (MULTIVITAMIN) tablet Take 1 tablet by mouth daily.   omega-3 acid ethyl esters (LOVAZA) 1 g capsule Take 1 g by mouth daily.     Allergies  Allergen Reactions   Cefuroxime Axetil Palpitations   Codeine Nausea And Vomiting and Other (See Comments)    Gi Upset    Morphine Palpitations and Rash   Quinidine Gluconate Other (See Comments)    Positive  ANA, tachycardia   Amoxicillin-Pot Clavulanate     Other Reaction(s): GI intolerance only   Other Nausea And Vomiting    general anesthesia - severe nausea and vomiting  Other Reaction(s): causes Long QT   Echinacea Nausea And Vomiting   Latex Rash      Review of Systems negative except from HPI and PMH  Physical Exam BP 124/76   Pulse 68   Ht 5' 2 (1.575 m)   Wt 118 lb (53.5 kg)   SpO2 99%   BMI 21.58 kg/m  Well developed and well nourished in no acute distress HENT normal E scleral and icterus clear Neck Supple JVP flat; carotids brisk and full Clear to ausculation Regular rate and rhythm, no murmurs gallops or rub Soft with active bowel sounds No clubbing cyanosis  Edema Alert and oriented, grossly normal motor and sensory function Skin Warm and Dry  ECG atrial pacing @ 68 20/10/42   CrCl cannot be calculated (Patient's most recent lab result is older than the maximum 21 days allowed.).   Assessment and  Plan Sinus node dysfunction  Aborted Cardiac Arrest  ICD Medtronic  secondary prevention-- prior lead revision  CAD s/p CABG  Mitral valve disease s/p repair    No bleeding -- continue Apixaban  and dofetilide-- will check labs  Lengthy discussion regarding followup-- she has a long standing relationship at [facility] and would prefer that, and we will be available through [facility] or --will have her set up with AM for a local EP   Euvolemic on her WEIRD concoction of amiloride and furosoemide       Current medicines are reviewed at length with the patient today .  The patient does not have concerns regarding medicines.

## 2023-07-27 NOTE — Patient Instructions (Signed)
Medication Instructions:  Your physician recommends that you continue on your current medications as directed. Please refer to the Current Medication list given to you today.  *If you need a refill on your cardiac medications before your next appointment, please call your pharmacy*   Lab Work: None ordered.  If you have labs (blood work) drawn today and your tests are completely normal, you will receive your results only by: MyChart Message (if you have MyChart) OR A paper copy in the mail If you have any lab test that is abnormal or we need to change your treatment, we will call you to review the results.   Testing/Procedures: None ordered.    Follow-Up: At [facility], you and your health needs are our priority.  As part of our continuing mission to provide you with exceptional heart care, we have created designated Provider Care Teams.  These Care Teams include your primary Cardiologist (physician) and Advanced Practice Providers (APPs -  Physician Assistants and Nurse Practitioners) who all work together to provide you with the care you need, when you need it.  We recommend signing up for the patient portal called "MyChart".  Sign up information is provided on this After Visit Summary.  MyChart is used to connect with patients for Virtual Visits (Telemedicine).  Patients are able to view lab/test results, encounter notes, upcoming appointments, etc.  Non-urgent messages can be sent to your provider as well.   To learn more about what you can do with MyChart, go to [email or web address].    Your next appointment:   12 months with Dr Nelly Laurence

## 2023-08-08 ENCOUNTER — Telehealth: Payer: Self-pay

## 2023-08-08 DIAGNOSIS — I484 Atypical atrial flutter: Secondary | ICD-10-CM

## 2023-08-08 DIAGNOSIS — Z79899 Other long term (current) drug therapy: Secondary | ICD-10-CM

## 2023-08-08 DIAGNOSIS — I4901 Ventricular fibrillation: Secondary | ICD-10-CM

## 2023-08-08 NOTE — Telephone Encounter (Signed)
 Spoke with pt and advised per Dr Rodolfo Clan pt needs updated lab work to include CBC, BMET and Mg.  Pt verbalizes understanding and thanked Charity fundraiser for the call.  Orders placed and released.

## 2023-08-10 LAB — BASIC METABOLIC PANEL
BUN/Creatinine Ratio: 22 (ref 12–28)
BUN: 16 mg/dL (ref 8–27)
CO2: 22 mmol/L (ref 20–29)
Calcium: 8.7 mg/dL (ref 8.7–10.3)
Chloride: 102 mmol/L (ref 96–106)
Creatinine, Ser: 0.72 mg/dL (ref 0.57–1.00)
Glucose: 111 mg/dL — ABNORMAL HIGH (ref 70–99)
Potassium: 4.3 mmol/L (ref 3.5–5.2)
Sodium: 140 mmol/L (ref 134–144)
eGFR: 87 mL/min/{1.73_m2} (ref >59)

## 2023-08-16 ENCOUNTER — Telehealth: Payer: Self-pay | Admitting: Internal Medicine

## 2023-08-16 DIAGNOSIS — I484 Atypical atrial flutter: Secondary | ICD-10-CM

## 2023-08-16 DIAGNOSIS — I495 Sick sinus syndrome: Secondary | ICD-10-CM

## 2023-08-16 DIAGNOSIS — Z79899 Other long term (current) drug therapy: Secondary | ICD-10-CM

## 2023-08-16 DIAGNOSIS — I4891 Unspecified atrial fibrillation: Secondary | ICD-10-CM

## 2023-08-16 NOTE — Telephone Encounter (Signed)
Spoke with patient and she stated she just had labs drawn. She would like to know why do she need more labs.

## 2023-08-16 NOTE — Telephone Encounter (Signed)
Pt is requesting a callback regarding her needing more labs done and she'd like to know why since she already had labs done recently. Please advise

## 2023-08-17 NOTE — Progress Notes (Signed)
Remote ICD transmission.

## 2023-08-17 NOTE — Addendum Note (Signed)
Addended by: Geralyn Flash D on: 08/17/2023 09:29 AM   Modules accepted: Orders

## 2023-08-20 NOTE — Telephone Encounter (Signed)
Spoke with pt and advised CBC and Mg was not drawn by [facility].  Pt will need to have labs drawn.  Pt requests a repeat BMET for K+ level as she has been told by provider in [place] her K+ should be at 5 and pt should adjust KCL accordingly which she has done.  Pt advised BMET can be reordered but may not be covered by insurance.  Pt verbalizes understanding and thanked Charity fundraiser for the call.  She will plan to have labs drawn 08/21/2023.

## 2023-08-20 NOTE — Telephone Encounter (Signed)
Patient is calling to check on status of her call from last week.  She is getting ready to go out of town later this week, and would like to go for for the lab work before she leaves.

## 2023-08-21 LAB — BASIC METABOLIC PANEL
BUN/Creatinine Ratio: 25 (ref 12–28)
BUN: 18 mg/dL (ref 8–27)
CO2: 20 mmol/L (ref 20–29)
Calcium: 9 mg/dL (ref 8.7–10.3)
Chloride: 104 mmol/L (ref 96–106)
Creatinine, Ser: 0.72 mg/dL (ref 0.57–1.00)
Glucose: 108 mg/dL — ABNORMAL HIGH (ref 70–99)
Potassium: 4.7 mmol/L (ref 3.5–5.2)
Sodium: 141 mmol/L (ref 134–144)
eGFR: 87 mL/min/{1.73_m2} (ref >59)

## 2023-08-21 LAB — MAGNESIUM: Magnesium: 1.9 mg/dL (ref 1.6–2.3)

## 2023-08-22 LAB — CBC
Hematocrit: 38.5 % (ref 34.0–46.6)
Hemoglobin: 13 g/dL (ref 11.1–15.9)
MCH: 31.6 pg (ref 26.6–33.0)
MCHC: 33.8 g/dL (ref 31.5–35.7)
MCV: 94 fL (ref 79–97)
Platelets: 256 10*3/uL (ref 150–450)
RBC: 4.11 x10E6/uL (ref 3.77–5.28)
RDW: 12.2 % (ref 11.7–15.4)
WBC: 4.2 10*3/uL (ref 3.4–10.8)

## 2023-10-24 ENCOUNTER — Ambulatory Visit: Payer: Medicare Other | Admitting: Cardiology

## 2023-10-29 ENCOUNTER — Ambulatory Visit: Payer: Medicare Other | Attending: Cardiology | Admitting: Cardiology

## 2023-10-29 ENCOUNTER — Encounter: Payer: Self-pay | Admitting: Cardiology

## 2023-10-29 VITALS — BP 108/72 | HR 72 | Resp 16 | Ht 62.0 in | Wt 116.8 lb

## 2023-10-29 DIAGNOSIS — I4901 Ventricular fibrillation: Secondary | ICD-10-CM | POA: Diagnosis not present

## 2023-10-29 DIAGNOSIS — I480 Paroxysmal atrial fibrillation: Secondary | ICD-10-CM | POA: Diagnosis not present

## 2023-10-29 DIAGNOSIS — I495 Sick sinus syndrome: Secondary | ICD-10-CM | POA: Diagnosis not present

## 2023-10-29 DIAGNOSIS — Z9889 Other specified postprocedural states: Secondary | ICD-10-CM | POA: Diagnosis not present

## 2023-10-29 NOTE — Progress Notes (Signed)
 Cardiology Office Note:  .   Date:  10/29/2023  ID:  Nicole Hampton, DOB [birth year], MRN [id number] PCP: Creola Corn, MD  [facility] Providers Cardiologist:  Yates Decamp, MD   History of Present Illness: .   Nicole Hampton is a 78 y.o. Caucasian female patient with chronotropic incompetence and sinus node dysfunction, paroxysmal atrial fibrillation and atypical atrial flutter, PSVT SP ablation in the remote past, primary hypertension, mitral valve repair in 2007 and history of recurrent ventricular fibrillation arrest starting in 1986 and in 1996 and had [employer] Evera dual-chamber ICD and generator change out in Apr 2023.   She has recently found to have increased episodes of atrial fibrillation from <0.1%, has increased to about 6.5% and EP has been concerned about her chronic elevated ventricular thresholds leading to battery depletion when she is in A-fib as she has sinus node dysfunction and AV nodal disease.  The plan to proceed with EP study and ablation of atrial fibrillation.  Discussed the use of AI scribe software for clinical note transcription with the patient, who gave verbal consent to proceed.  History of Present Illness   The patient, with a history of atrial fibrillation (AFib), presents with an increased incidence of AFib episodes. The patient reports that the episodes have increased to about 11% since Christmas, when she contracted an illness from her grandchildren. The patient believes that this illness may have triggered the increase in AFib episodes.   The patient is currently on dofetilide, taking one dose in the morning and one at night. The patient is scheduled for an ablation procedure in July, but is hesitant about the procedure due to previous unsuccessful ablations. The patient is considering increasing the dosage of her current medication as an alternative to the ablation procedure.     Labs   Lab Results  Component Value Date   NA 141 08/21/2023   K 4.7  08/21/2023   CO2 20 08/21/2023   GLUCOSE 108 (H) 08/21/2023   BUN 18 08/21/2023   CREATININE 0.72 08/21/2023   CALCIUM 9.0 08/21/2023   EGFR 87 08/21/2023   GFRNONAA >60 01/29/2019      Latest Ref Rng & Units 08/21/2023    7:45 AM 08/10/2023    9:40 AM 09/30/2020    9:55 AM  BMP  Glucose 70 - 99 mg/dL 782  956  86   BUN 8 - 27 mg/dL 18  16  19    Creatinine 0.57 - 1.00 mg/dL 2.13  0.86  5.78   BUN/Creat Ratio 12 - 28 25  22  23    Sodium 134 - 144 mmol/L 141  140  140   Potassium 3.5 - 5.2 mmol/L 4.7  4.3  5.0   Chloride 96 - 106 mmol/L 104  102  103   CO2 20 - 29 mmol/L 20  22  20    Calcium 8.7 - 10.3 mg/dL 9.0  8.7  9.2       Latest Ref Rng & Units 08/21/2023    7:44 AM 09/30/2020    9:55 AM 01/29/2019    2:16 PM  CBC  WBC 3.4 - 10.8 x10E3/uL 4.2  4.7  4.6   Hemoglobin 11.1 - 15.9 g/dL 46.9  62.9  52.8   Hematocrit 34.0 - 46.6 % 38.5  40.5  38.3   Platelets 150 - 450 x10E3/uL 256  271  223    External Labs:  [facility] labs 06/19/2023:  Total cholesterol 216, triglycerides 236, HDL 51, LDL  118.  Labs 10/06/2023:  TSH normal at 1.90.  BUN 15, creatinine 0.720, EGFR 120 mL.  Review of Systems  Constitutional: Positive for malaise/fatigue.  Cardiovascular:  Negative for chest pain, dyspnea on exertion and leg swelling.   Physical Exam:   VS:  BP 108/72 (BP Location: Left Arm, Patient Position: Sitting, Cuff Size: Normal)   Pulse 72   Resp 16   Ht 5\' 2"  (1.575 m)   Wt 116 lb 12.8 oz (53 kg)   SpO2 94%   BMI 21.36 kg/m    Wt Readings from Last 3 Encounters:  10/29/23 116 lb 12.8 oz (53 kg)  07/27/23 118 lb (53.5 kg)  06/07/23 119 lb 6.4 oz (54.2 kg)    Physical Exam Neck:     Vascular: No carotid bruit or JVD.  Cardiovascular:     Rate and Rhythm: Normal rate and regular rhythm.     Pulses: Intact distal pulses.     Heart sounds: Normal heart sounds. No murmur heard.    No gallop.  Pulmonary:     Effort: Pulmonary effort is normal.     Breath sounds: Normal  breath sounds.  Abdominal:     General: Bowel sounds are normal.     Palpations: Abdomen is soft.  Musculoskeletal:     Right lower leg: No edema.     Left lower leg: No edema.    Studies Reviewed: .    MYOCARDIAL PERFUSION IMAGING 01/17/2021  The left ventricular ejection fraction is normal (55-65%).  Nuclear stress EF: 55%.  There was no ST segment deviation noted during stress.  Defect 1: There is a medium defect of moderate severity present in the basal inferior, apical inferior and apex location.  This is a low risk study.  ECHOCARDIOGRAM COMPLETE 10/25/2020  1. Basal inferior/inferolateral hypokinesis. Left ventricular ejection fraction, by estimation, is 60 to 65%. The left ventricle has normal function. There is mild left ventricular hypertrophy. Left ventricular diastolic parameters are indeterminate. 2. Right ventricular systolic function is normal. The right ventricular size is normal. 3. Left atrial size was mildly dilated. 4. S/p MV repair (Simplicity annuloplasty band, 08/15/05) Peak and mean gradients through the valve are 27 and 11 mm Hg respectively. Mild mitral stenosis. No significant change from echo done in 12/09/18. The mitral valve has been repaired/replaced. Mild mitral valve regurgitation. 5. The aortic valve is normal in structure. Aortic valve regurgitation is not visualized. 6. The inferior vena cava is normal in size with <50% respiratory variability, suggesting right atrial pressure of 8 mmHg.  Remote ICD transmission 10/22/2023: Normal device function. AP: 90.9%,  VP: 20.7%. 5 monitored and 23 treated AT/AF episodes (6.4% burden), 9 of 23 reported as successfully pace-terminated. Patient has h/o PAF; med list  includes Eliquis and Tikosyn.  1 SVT-ST (146 bpm for 44 sec), onset not recorded.  Lead trends, histograms, and other diagnostic trends stable. Estimated 9.1 years (17-Oct-2023)  remaining until ERI.   EKG:   EKG 07/27/2023: Atrially paced and  ventricular sensed rhythm at rate of 68 bpm.  Medications and allergies    Allergies  Allergen Reactions   Cefuroxime Axetil Palpitations   Codeine Nausea And Vomiting and Other (See Comments)    Gi Upset    Morphine Palpitations and Rash   Quinidine Gluconate Other (See Comments)    Positive ANA, tachycardia   Amoxicillin-Pot Clavulanate     Other Reaction(s): GI intolerance only   Other Nausea And Vomiting    general anesthesia -  severe nausea and vomiting  Other Reaction(s): causes Long QT   Echinacea Nausea And Vomiting   Latex Rash     Current Outpatient Medications:    aMILoride (MIDAMOR) 5 MG tablet, Take 5 mg by mouth daily., Disp: , Rfl:    amoxicillin (AMOXIL) 500 MG capsule, Take 500 mg by mouth every 8 (eight) hours., Disp: , Rfl:    Ascorbic Acid (VITAMIN C PO), Take 1 tablet by mouth daily., Disp: , Rfl:    Calcium Carbonate-Vitamin D (CALTRATE 600+D PO), Take 3 tablets by mouth daily. , Disp: , Rfl:    dofetilide (TIKOSYN) 250 MCG capsule, Take 250 mcg by mouth 2 (two) times daily., Disp: , Rfl:    ELIQUIS 5 MG TABS tablet, TAKE ONE TABLET TWICE DAILY, Disp: 60 tablet, Rfl: 6   erythromycin ophthalmic ointment, Place 1 Application into both eyes 4 (four) times daily., Disp: , Rfl:    estradiol (ESTRACE) 2 MG tablet, Take 1 mg by mouth at bedtime., Disp: , Rfl:    ezetimibe (ZETIA) 10 MG tablet, Take 10 mg by mouth daily., Disp: , Rfl:    fexofenadine (ALLEGRA) 180 MG tablet, Take 180 mg by mouth daily as needed for allergies or rhinitis., Disp: , Rfl:    furosemide (LASIX) 20 MG tablet, TAKE ONE-HALF TABLET BY MOUTH THREE TIMES A WEEK (Patient taking differently: Take 20 mg by mouth as needed.), Disp: 15 tablet, Rfl: 11   levothyroxine (SYNTHROID) 25 MCG tablet, Take 25 mcg by mouth daily before breakfast., Disp: , Rfl:    Magnesium Cl-Calcium Carbonate (SLOW-MAG PO), Take 1 tablet by mouth daily., Disp: , Rfl:    meclizine (ANTIVERT) 12.5 MG tablet, Take 12.5 mg  by mouth 3 (three) times daily as needed for dizziness., Disp: , Rfl:    Multiple Vitamin (MULTIVITAMIN) tablet, Take 1 tablet by mouth daily., Disp: , Rfl:    omega-3 acid ethyl esters (LOVAZA) 1 g capsule, Take 1 g by mouth daily. , Disp: , Rfl:    potassium chloride (MICRO-K) 10 MEQ CR capsule, Take 4 capsules (40 mEq total) by mouth 2 (two) times daily. On day of Lasix take one tablet by mouth extra ( 10 meq) X 3 weekly., Disp: 240 capsule, Rfl: 11   No orders of the defined types were placed in this encounter.    Medications Discontinued During This Encounter  Medication Reason   azelastine (ASTELIN) 0.1 % nasal spray Patient Preference   fluticasone (FLONASE) 50 MCG/ACT nasal spray Patient Preference     ASSESSMENT AND PLAN: .      ICD-10-CM   1. Sinus node dysfunction (HCC)  I49.5     2. Ventricular fibrillation (HCC) arest 1986 S/P ICD placement in 1996  I49.01     3. Paroxysmal atrial fibrillation (HCC)  I48.0     4. S/P MVR (mitral valve repair) 2007  Z98.890      Assessment and Plan    Atrial Fibrillation (AFib)   Atrial fibrillation burden has increased to 11%, leading to rapid pacemaker battery depletion due to a high ventricular pacing threshold. This raises concerns for potential syncope if the pacemaker fails.   She is currently on dofetilide 250 mcg twice daily, with a possible dose increase under consideration for better management. She prefers medication management over ablation unless necessary. An ablation is scheduled for July as a backup. Increasing the dofetilide dose may require hospitalization for monitoring due to associated risks. Consult Dr. Howell Pringle at [facility], regarding the  dofetilide dosage increase and consider hospitalization for dose adjustment if necessary. Maintain the scheduled ablation in July as a backup plan.  Pacemaker Dependency   She is dependent on the pacemaker 90-95% of the time. The high ventricular pacing  threshold is causing rapid battery depletion, risking syncope if the pacemaker fails. Addressing AFib with medication or ablation is crucial to prevent battery depletion and potential syncope. Coordinate with electrophysiology for potential adjustments or interventions.  Hyperlipidemia   A lipid panel from November 2024 shows elevated total cholesterol at 216 mg/dL, triglycerides at 161 mg/dL, LDL at 096 mg/dL, and HDL at 51 mg/dL.  She has no known vascular disease, prefers not to be on a statin, no changes in the medications were done today.  Mitral valve repair Echocardiogram from 2022 reviewed, she has had excellent mitral valve repair.  Endocarditis prophylaxis is indicated.  I will see her back in a year and continue to monitor her cardiac issues between [facility] and locally with me as she prefers to have someone local in case of an emergency.   Signed,  Yates Decamp, MD, [facility] 10/29/2023, 2:28 PM [facility] [address] [place], [place] [address] Phone: [phone]. Fax:  [phone]      CC: Dr. Howell Pringle at [facility]

## 2023-10-29 NOTE — Patient Instructions (Signed)
 Medication Instructions:  Your physician recommends that you continue on your current medications as directed. Please refer to the Current Medication list given to you today.  *If you need a refill on your cardiac medications before your next appointment, please call your pharmacy*  Lab Work: none If you have labs (blood work) drawn today and your tests are completely normal, you will receive your results only by: MyChart Message (if you have MyChart) OR A paper copy in the mail If you have any lab test that is abnormal or we need to change your treatment, we will call you to review the results.  Testing/Procedures: none  Follow-Up: At [facility], you and your health needs are our priority.  As part of our continuing mission to provide you with exceptional heart care, our providers are all part of one team.  This team includes your primary Cardiologist (physician) and Advanced Practice Providers or APPs (Physician Assistants and Nurse Practitioners) who all work together to provide you with the care you need, when you need it.  Your next appointment:   12 month(s)  Provider:   Yates Decamp, MD     We recommend signing up for the patient portal called "MyChart".  Sign up information is provided on this After Visit Summary.  MyChart is used to connect with patients for Virtual Visits (Telemedicine).  Patients are able to view lab/test results, encounter notes, upcoming appointments, etc.  Non-urgent messages can be sent to your provider as well.   To learn more about what you can do with MyChart, go to [email or web address].   Other Instructions        1st Floor: - Lobby - Registration  - Pharmacy  - Lab - Cafe  2nd Floor: - PV Lab - Diagnostic Testing (echo, CT, nuclear med)  3rd Floor: - Vacant  4th Floor: - TCTS (cardiothoracic surgery) - AFib Clinic - Structural Heart Clinic - Vascular Surgery  - Vascular Ultrasound  5th Floor: - HeartCare Cardiology  (general and EP) - Clinical Pharmacy for coumadin, hypertension, lipid, weight-loss medications, and med management appointments    Valet parking services will be available as well.

## 2024-05-07 ENCOUNTER — Encounter: Payer: Self-pay | Admitting: Podiatry

## 2024-05-07 ENCOUNTER — Ambulatory Visit: Admitting: Podiatry

## 2024-05-07 DIAGNOSIS — M2042 Other hammer toe(s) (acquired), left foot: Secondary | ICD-10-CM | POA: Diagnosis not present

## 2024-05-07 DIAGNOSIS — M2041 Other hammer toe(s) (acquired), right foot: Secondary | ICD-10-CM | POA: Diagnosis not present

## 2024-05-07 DIAGNOSIS — M779 Enthesopathy, unspecified: Secondary | ICD-10-CM | POA: Diagnosis not present

## 2024-05-07 NOTE — Progress Notes (Signed)
 Subjective:   Patient ID: Nicole Hampton, female   DOB: 78 y.o.   MRN: [id number]   HPI Patient presents stating she has had chronic pain in both of her feet and it hurts underneath with the big toe left lifting and getting very irritated   ROS      Objective:  Physical Exam  Neurovascular status found to be intact muscle strength range of motion adequate with patient found to have a lifted big toe left that is pressing against the top of the shoe with instability of the joint surface noted.  It is also creating inflammation of the first MPJ and distal     Assessment:  Several issues with 1 being inflammatory capsulitis secondarily elevated hallux with the probability that there may have been some tearing of the flexor plate with structural deformity and nail irritation     Plan:  H&P all conditions reviewed.  We are going to try a new orthotic to offload weight off the first MPJ and to try to lower the hallux bilateral and patient is casted currently.  I then discussed cushioning and I dispensed cushioning for the big toe left and I did discuss first metatarsal phalangeal joint fusion which may be necessary at 1 point in future

## 2024-06-25 ENCOUNTER — Telehealth: Payer: Self-pay

## 2024-06-25 NOTE — Telephone Encounter (Signed)
 Called patient to have this appt r/s . Unable to connect left a VM

## 2024-06-26 ENCOUNTER — Other Ambulatory Visit

## 2024-07-04 ENCOUNTER — Ambulatory Visit

## 2024-07-04 DIAGNOSIS — M2042 Other hammer toe(s) (acquired), left foot: Secondary | ICD-10-CM

## 2024-07-04 DIAGNOSIS — M722 Plantar fascial fibromatosis: Secondary | ICD-10-CM

## 2024-07-04 DIAGNOSIS — M21619 Bunion of unspecified foot: Secondary | ICD-10-CM

## 2024-07-04 DIAGNOSIS — M2041 Other hammer toe(s) (acquired), right foot: Secondary | ICD-10-CM

## 2024-07-04 NOTE — Progress Notes (Signed)
 Patient presents today to pick up custom molded foot orthotics recommended by Dr. Pasco Ovens.   Orthotics were dispensed and fit was satisfactory. Reviewed instructions for break-in and wear. Written instructions given to patient.  Patient will follow up as needed.   [facility] - order # [id number]

## 2024-07-04 NOTE — Patient Instructions (Signed)

## 2024-07-28 ENCOUNTER — Telehealth: Payer: Self-pay | Admitting: Cardiology

## 2024-07-28 NOTE — Telephone Encounter (Signed)
 Needs visit to discuss as it is complex and would see me or A. Fib clinic

## 2024-07-28 NOTE — Telephone Encounter (Signed)
 Patient c/o Palpitations: STAT if patient c/o lightheadedness, shortness of breath, or chest pain  How long have you had palpitations/irregular HR/ Afib? Are you having the symptoms now?  In and out of afib the past 6 weeks. She doesn't think she's in afib currently. [facility] [employer] registered normal sinus rhythm the past 2 days. Assumes Dofetilide needs to be adjusted.  Are you currently experiencing lightheadedness, SOB or CP?  No   Do you have a history of afib (atrial fibrillation) or irregular heart rhythm?  Hx afib  Have you checked your BP or HR? (document readings if available):  HR has been about 85-97 which is normal for her. Hasn't checked BP, but says she normally doesn't have issues.  Are you experiencing any other symptoms?  No

## 2024-07-28 NOTE — Telephone Encounter (Signed)
 Spoke with pt regarding her afib. Pt stated she is sure she has been in and out of afib for the last 6 weeks. Pt stated she got the flu Thanksgiving weekend and believes that's what caused her to go into afib. Pt stated the doctors at [facility] cut back her tikosyn due to her qtc on 06/03/24. Pt stated she would like Dr. Godfrey opinion about this. Pt denies and lightheadedness or dizziness, chest pain or shortness of breath. Pt was given ED precautions and told that Dr. Ladona would be notified. Pt verbalized understanding. All questions if any were answered.

## 2024-07-29 NOTE — Telephone Encounter (Signed)
 Called patient and put her on Dr. Godfrey schedule next week.

## 2024-08-05 LAB — LAB REPORT - SCANNED: EGFR: 81.4

## 2024-08-07 ENCOUNTER — Ambulatory Visit: Attending: Cardiology | Admitting: Cardiology

## 2024-08-07 ENCOUNTER — Encounter: Payer: Self-pay | Admitting: Cardiology

## 2024-08-07 ENCOUNTER — Other Ambulatory Visit (HOSPITAL_COMMUNITY): Payer: Self-pay

## 2024-08-07 VITALS — BP 124/72 | HR 69 | Resp 16 | Ht 62.0 in | Wt 118.0 lb

## 2024-08-07 DIAGNOSIS — Z95810 Presence of automatic (implantable) cardiac defibrillator: Secondary | ICD-10-CM | POA: Diagnosis not present

## 2024-08-07 DIAGNOSIS — I5032 Chronic diastolic (congestive) heart failure: Secondary | ICD-10-CM

## 2024-08-07 DIAGNOSIS — Z5181 Encounter for therapeutic drug level monitoring: Secondary | ICD-10-CM | POA: Diagnosis not present

## 2024-08-07 DIAGNOSIS — I480 Paroxysmal atrial fibrillation: Secondary | ICD-10-CM

## 2024-08-07 DIAGNOSIS — I495 Sick sinus syndrome: Secondary | ICD-10-CM

## 2024-08-07 MED ORDER — METOPROLOL TARTRATE 25 MG PO TABS
25.0000 mg | ORAL_TABLET | Freq: Two times a day (BID) | ORAL | 2 refills | Status: DC
  Filled 2024-08-07: qty 60, 30d supply, fill #0

## 2024-08-07 MED ORDER — METOPROLOL TARTRATE 25 MG PO TABS: 25.0000 mg | ORAL_TABLET | Freq: Two times a day (BID) | ORAL | 2 refills | Status: AC

## 2024-08-07 MED ORDER — SPIRONOLACTONE 25 MG PO TABS
25.0000 mg | ORAL_TABLET | Freq: Every day | ORAL | 3 refills | Status: DC
  Filled 2024-08-07: qty 90, 90d supply, fill #0

## 2024-08-07 MED ORDER — SPIRONOLACTONE 25 MG PO TABS: 25.0000 mg | ORAL_TABLET | Freq: Every day | ORAL | 3 refills | Status: AC

## 2024-08-07 NOTE — Progress Notes (Signed)
 " Cardiology Office Note:  .   Date:  08/07/2024  ID:  Nicole Hampton, DOB 1946/03/20, MRN [id number] PCP: Onita Rush, MD  [facility] Providers Cardiologist:  Gordy Bergamo, MD   History of Present Illness: .   Nicole Hampton is a 79 y.o. Caucasian female patient with chronotropic incompetence and sinus node dysfunction, paroxysmal atrial fibrillation and atypical atrial flutter, PSVT SP ablation in the remote past, primary hypertension, mitral valve repair in 2007 and history of recurrent ventricular fibrillation arrest starting in 1986 and in 1996 and had Medtronic Evera dual-chamber ICD and generator change out in Apr 2023.    She has recently found to have increased episodes of atrial fibrillation from <0.1%, has increased to about 6.5% and EP has been concerned about her chronic elevated ventricular thresholds leading to battery depletion when she is in A-fib as she has sinus node dysfunction and AV nodal disease.  Extensive cardiac history, multiple visits to her prior cardiologist and EP, records reviewed.  Recently due to?  Prolonged QT, dofetilide dose was reduced from 250 mcg twice daily to 125 mcg twice daily and since then has had more frequent episodes of atrial fibrillation, with paroxysmal episodes of shortness of breath and abdominal swelling.  No dizziness or syncope.  Husband present.    Discussed the use of AI scribe software for clinical note transcription with the patient, who gave verbal consent to proceed.  History of Present Illness Nicole Hampton is a 79 year old female with atrial fibrillation who presents with increased frequency of AFib episodes.  She was on dofetilide 250 mcg twice daily, which was decreased to 125 mcg twice daily in October due to prolonged QT interval. Since the dose reduction, she has had more frequent AFib episodes, especially in November, with shortness of breath and a pounding heartbeat.  During a recent flu illness her potassium  decreased from 5.1 to 4.5 despite extra supplementation, which she associates with a change in generic potassium. She continues potassium and daily magnesium.  Her eGFR has fallen to 81 from prior higher levels. She increased water intake to three to four 20-ounce bottles daily after stopping caffeine-free Diet Coke.  She notes abdominal swelling. She has avoided furosemide  for three months due to concern about symptoms. Current cardiac-related medications are dofetilide 125 mcg twice daily, potassium supplements, and prior use of amiloride and furosemide .  Cardiac Studies relevent.     CT heart for cardiac morphology: 01/29/2024:  Total calcium score is 0.2. Mesa percentile is 8.   MYOCARDIAL PERFUSION IMAGING 01/17/2021  The left ventricular ejection fraction is normal (55-65%).  Nuclear stress EF: 55%.  There was no ST segment deviation noted during stress.  Defect 1: There is a medium defect of moderate severity present in the basal inferior, apical inferior and apex location.  This is a low risk study.  ECHOCARDIOGRAM COMPLETE 10/25/2020  1. Basal inferior/inferolateral hypokinesis. Left ventricular ejection fraction, by estimation, is 60 to 65%. The left ventricle has normal function. There is mild left ventricular hypertrophy. Left ventricular diastolic parameters are indeterminate. 2. Right ventricular systolic function is normal. The right ventricular size is normal. 3. Left atrial size was mildly dilated. 4. S/p MV repair (Simplicity annuloplasty band, 08/15/05) Peak and mean gradients through the valve are 27 and 11 mm Hg respectively. Mild mitral stenosis. No significant change from echo done in 12/09/18. The mitral valve has been repaired/replaced. Mild mitral valve regurgitation. 5. The aortic valve is normal in  structure. Aortic valve regurgitation is not visualized. 6. The inferior vena cava is normal in size with <50% respiratory variability, suggesting right atrial pressure  of 8 mmHg.  Remote dual-chamber ICD transmission 07/16/2024: PRESENTING RHYTHM  ApVs 60s with ectopy  UNDERLYING RHYTHM  Unk  LEAD STATUS PER TRENDS  Stable  VENTRICULAR HIGH RATES  65 VT, logged up to 16sec with VR 108-130s. 72 SVT, longest 37sec with VR 130-150s  ATRIAL HIGH RATES and ANTICOAGULATION  4.2% burden and med list includes Eliquis    EKG:   EKG Interpretation Date/Time:  Thursday August 07 2024 15:04:05 EST Ventricular Rate:  69 PR Interval:  214 QRS Duration:  104 QT Interval:  408 QTC Calculation: 437 R Axis:   44  Text Interpretation: EKG 08/07/2024: AV paced rhythm at rate of 69 bpm.  Left bundle pacing R septal pacing.  Occasional PACs.  Ventricularly paced rhythm with aberrancy (2).  Compared to 07/27/2023, ventricular pacing is new. Confirmed by Lorianne Malbrough, Jagadeesh [phone]) on 08/07/2024 3:11:03 PM  Labs   Care everywhere/Faxed External Labs:  Labs 08/05/2024:  Serum glucose 87 mg, BUN 20, creatinine 0.7, eGFR 81 mL, potassium 4.5.  ROS  Review of Systems  Cardiovascular:  Positive for dyspnea on exertion and palpitations. Negative for chest pain and leg swelling.   Physical Exam:   VS:  BP 124/72 (BP Location: Right Arm, Patient Position: Sitting, Cuff Size: Normal)   Pulse 69   Resp 16   Ht 5' 2 (1.575 m)   Wt 118 lb (53.5 kg)   SpO2 99%   BMI 21.58 kg/m    Wt Readings from Last 3 Encounters:  08/07/24 118 lb (53.5 kg)  10/29/23 116 lb 12.8 oz (53 kg)  07/27/23 118 lb (53.5 kg)    BP Readings from Last 3 Encounters:  08/07/24 124/72  10/29/23 108/72  07/27/23 124/76   Physical Exam Neck:     Vascular: No carotid bruit or JVD.  Cardiovascular:     Rate and Rhythm: Tachycardia present. Rhythm irregular.     Pulses: Normal pulses and intact distal pulses.     Heart sounds: No murmur heard. Pulmonary:     Effort: Pulmonary effort is normal.     Breath sounds: Normal breath sounds.  Abdominal:     General: Bowel sounds are normal.      Palpations: Abdomen is soft.  Musculoskeletal:     Right lower leg: No edema.     Left lower leg: No edema.  Skin:    Capillary Refill: Capillary refill takes less than 2 seconds.     ASSESSMENT AND PLAN: .      ICD-10-CM   1. Sinus node dysfunction (HCC)  I49.5 EKG 12-Lead    2. Paroxysmal atrial fibrillation (HCC)  I48.0     3. S/P MVR (mitral valve repair) 2007  Z98.890     4. ICD Dual Chamber Medtronic Evera dual-chamber ICD in 1996 for VF in 1986 arrest  Z95.810      Assessment & Plan Paroxysmal atrial fibrillation Increased frequency of episodes following reduction of dofetilide due to prolonged QT interval. Symptoms include shortness of breath and fatigue. Current management includes dofetilide and a defibrillator. Metoprolol  tartrate is considered to manage heart rate and reduce symptoms. She is hesitant about beta blockers due to past experiences but is willing to try metoprolol  tartrate. - Prescribed metoprolol  tartrate 25 mg twice daily. - Advised to contact [facility] to discuss potential adjustment of dofetilide dosage. - Monitor  heart rate and symptoms.  Therapeutic drug monitoring for antiarrhythmic therapy Dofetilide dosage reduced due to prolonged QT interval.  QT interval is 510 ms and dosage was reduced from 250 mcg to 125 mcg however patient has paced rhythm. Defibrillator in place reduces concern for prolonged QT. Potential need for hospitalization if dofetilide dosage is adjusted. - Advised to contact [facility] EP to discuss potential adjustment of dofetilide dosage. - Monitor QT interval and heart rate.  Implantable cardioverter-defibrillator in place Defibrillator in place provides protection against arrhythmias. Reduces concern for prolonged QT interval.  Hypokalemia Potassium level decreased from 5.1 to 4.5. Current management includes potassium supplementation. Consideration of spironolactone  to manage potassium levels and fluid retention. Amiloride  discontinued due to mild effect. - Prescribed spironolactone  25 mg once daily. - Discontinued amiloride 5 mg once daily. - Ordered BMP to monitor potassium levels in 10 days. - Ordered magnesium level in 10 days.   Follow up: 6 weeks PAF, Dyspnea, follow-up labs and EP recommendation from [facility],  Gordy Bergamo, MD, [facility] 08/07/2024, 3:19 PM [facility] [address] [place], [place] [address] Phone: [phone]. Fax:  [phone]  "

## 2024-08-07 NOTE — Patient Instructions (Addendum)
 Medication Instructions:  START   metoprolol  tartrate (LOPRESSOR ) 25 MG tablet         Take 1 tablet (25 mg total) by mouth 2 (two) times daily., Starting Thu 08/07/2024, Until Wed 11/05/2024, Normal     spironolactone  (ALDACTONE ) 25 MG tablet        Take 1 tablet (25 mg total) by mouth daily   *If you need a refill on your cardiac medications before your next appointment, please call your pharmacy*  Lab Work: (10 days to 2 weeks) Lab Orders         Magnesium         Basic metabolic panel with GFR     If you have labs (blood work) drawn today and your tests are completely normal, you will receive your results only by: MyChart Message (if you have MyChart) OR A paper copy in the mail If you have any lab test that is abnormal or we need to change your treatment, we will call you to review the results.   Follow-Up: At [facility], you and your health needs are our priority.  As part of our continuing mission to provide you with exceptional heart care, our providers are all part of one team.  This team includes your primary Cardiologist (physician) and Advanced Practice Providers or APPs (Physician Assistants and Nurse Practitioners) who all work together to provide you with the care you need, when you need it.  Your next appointment:   September 11, 2024 @ 11am  Provider:   Gordy Bergamo, MD             We recommend signing up for the patient portal called MyChart.  Patients are able to view lab/test results, encounter notes, upcoming appointments, etc.  Non-urgent messages can be sent to your provider as well, go to [email or web address].

## 2024-09-11 ENCOUNTER — Ambulatory Visit: Admitting: Cardiology
